# Patient Record
Sex: Male | Born: 1965 | Race: White | Hispanic: Yes | Marital: Married | Smoking: Former smoker
Health system: Southern US, Community
[De-identification: ages and names within clinical notes are randomized; demographics above are authoritative.]

## PROBLEM LIST (undated history)

## (undated) DIAGNOSIS — E119 Type 2 diabetes mellitus without complications: Secondary | ICD-10-CM

## (undated) DIAGNOSIS — I10 Essential (primary) hypertension: Secondary | ICD-10-CM

## (undated) HISTORY — PX: COLONOSCOPY: SHX174

## (undated) HISTORY — DX: Essential (primary) hypertension: I10

## (undated) HISTORY — DX: Type 2 diabetes mellitus without complications: E11.9

## (undated) HISTORY — PX: HERNIA REPAIR: SHX51

---

## 1997-09-22 ENCOUNTER — Encounter: Admission: RE | Admit: 1997-09-22 | Discharge: 1997-09-22 | Payer: Self-pay | Admitting: *Deleted

## 1997-09-24 ENCOUNTER — Encounter: Admission: RE | Admit: 1997-09-24 | Discharge: 1997-09-24 | Payer: Self-pay | Admitting: *Deleted

## 1999-05-17 ENCOUNTER — Ambulatory Visit (HOSPITAL_COMMUNITY): Admission: RE | Admit: 1999-05-17 | Discharge: 1999-05-17 | Payer: Self-pay | Admitting: General Surgery

## 1999-05-17 ENCOUNTER — Encounter: Payer: Self-pay | Admitting: General Surgery

## 1999-05-25 ENCOUNTER — Encounter: Payer: Self-pay | Admitting: General Surgery

## 1999-05-25 ENCOUNTER — Ambulatory Visit (HOSPITAL_COMMUNITY): Admission: RE | Admit: 1999-05-25 | Discharge: 1999-05-25 | Payer: Self-pay | Admitting: General Surgery

## 1999-08-27 ENCOUNTER — Observation Stay (HOSPITAL_COMMUNITY): Admission: RE | Admit: 1999-08-27 | Discharge: 1999-08-28 | Payer: Self-pay | Admitting: General Surgery

## 2000-02-20 ENCOUNTER — Emergency Department (HOSPITAL_COMMUNITY): Admission: EM | Admit: 2000-02-20 | Discharge: 2000-02-21 | Payer: Self-pay | Admitting: Emergency Medicine

## 2000-02-21 ENCOUNTER — Encounter: Payer: Self-pay | Admitting: Emergency Medicine

## 2003-01-31 ENCOUNTER — Emergency Department (HOSPITAL_COMMUNITY): Admission: EM | Admit: 2003-01-31 | Discharge: 2003-01-31 | Payer: Self-pay | Admitting: Emergency Medicine

## 2004-07-02 ENCOUNTER — Ambulatory Visit: Payer: Self-pay | Admitting: Internal Medicine

## 2004-07-05 ENCOUNTER — Ambulatory Visit: Payer: Self-pay | Admitting: *Deleted

## 2004-08-31 ENCOUNTER — Ambulatory Visit: Payer: Self-pay | Admitting: Nurse Practitioner

## 2006-12-13 ENCOUNTER — Emergency Department (HOSPITAL_COMMUNITY): Admission: EM | Admit: 2006-12-13 | Discharge: 2006-12-14 | Payer: Self-pay | Admitting: Emergency Medicine

## 2009-11-17 ENCOUNTER — Encounter (INDEPENDENT_AMBULATORY_CARE_PROVIDER_SITE_OTHER): Payer: Self-pay | Admitting: *Deleted

## 2009-11-17 LAB — CONVERTED CEMR LAB
ALT: 22 units/L (ref 0–53)
AST: 18 units/L (ref 0–37)
Albumin: 4.9 g/dL (ref 3.5–5.2)
BUN: 19 mg/dL (ref 6–23)
Cholesterol: 210 mg/dL — ABNORMAL HIGH (ref 0–200)
Glucose, Bld: 91 mg/dL (ref 70–99)
Sodium: 141 meq/L (ref 135–145)
Triglycerides: 254 mg/dL — ABNORMAL HIGH (ref <150)
VLDL: 51 mg/dL — ABNORMAL HIGH (ref 0–40)

## 2010-05-21 NOTE — Op Note (Signed)
[facility]  Patient:    Steve Drake, Steve Drake                      MRN: [id number] Proc. Date: 08/27/99 Adm. Date:  [id number] Disc. Date: [id number] Attending:  Brandy Hale                           Operative Report  PREOPERATIVE DIAGNOSIS:  Bilateral inguinal herniae.  POSTOPERATIVE DIAGNOSIS:  Bilateral inguinal herniae.  OPERATION PERFORMED:  Laparoscopic repair of bilateral inguinal herniae with mesh.  SURGEON:  Angelia Mould. Derrell Lolling, M.D.  FIRST ASSISTANT:  Currie Paris, M.D.  OPERATIVE INDICATIONS:  This is a 45 year old Hispanic man who was evaluated back in May and June of this year.  He had some left lower quadrant abdominal pain and left groin pain.  On examination, he was found to have bilateral inguinal herniae, which were easily reducible, and some tenderness in the left lower quadrant of the abdomen.  He was treated with antibiotics and a liquid diet for presumed diverticulitis.  His blood work, CBC, amylase and urinalysis were normal.  A CT scan of the abdomen and pelvis was essentially normal.  He got somewhat better.  A barium enema showed two tiny diverticula in the sigmoid colon but otherwise, there was no evidence of diverticulitis and no evidence of stricture.  It was unclear as to why he had left lower quadrant abdominal pain, but it was felt that his herniae might have been contributing to this.  Elective bilateral hernia repairs were offered to him and he has elected to have this done at this time.  OPERATIVE TECHNIQUE:  Following the induction of general endotracheal anesthesia, the patients abdomen was prepped and draped in a sterile fashion. The bladder had been emptied with a Foley catheter.  Marcaine 0.5% with epinephrine were used as a local-infiltration anesthetic.  A transverse incision was made just below the umbilicus.  The fascia was incised transversely, exposing the left rectus muscle medially.  With  blunt dissection, we dissected the rectus muscle away from the posterior rectus sheath and inserted a dissector balloon into the left rectus sheath.  A video camera was inserted and the dissector balloon was inflated under direct vision.  We had good visualization and good dissection bilaterally.  We could see the muscle of the rectus anteriorly, the preperitoneal fat posteriorly, the symphysis pubis in the distance and we could see the inferior epigastric vessels bilaterally on the posterior bellies of the rectus muscles.  After holding the balloon in place for three or four minutes, we deflated the balloon and then inserted an insufflation trocar, inflated the balloon and secured this.  This was connected to the insufflating device at 12 mmHg maximum pressure.  We had a reasonable space to operate in; we could see all the anatomy well.  I found that the patient had bilateral direct inguinal herniae.  On the right side, the hernia was quite large and on the left side, it was smaller.  We dissected all of the incarcerated preperitoneal fat out of the direct hernia sac.  We let the pseudosac retract.  We dissected and identified the symphysis pubis and Coopers ligament bilaterally.  We identified the cord structures bilaterally and dissected these out.  We identified the vas deferens and testicular vessels on both sides.  The patient did not have any evidence of an indirect sac.  The peritoneum on both  sides was dissected back a bit out of the way.  We repaired the herniae on each side with a 4 x 6-inch piece of polypropylene mesh.  The mesh was cut and then rolled up and inserted into the preperitoneal space.  The mesh on each side was deployed transversely, so as to lightly overlap the midline medially and to slightly overlap Coopers ligament inferiorly.  The mesh was tacked in place with a 5-mm tacking device.  We tacked the mesh on each side on the superior rim of the symphysis pubis  and the superior rim of Coopers ligament, then up along the midline, then across the posterior belly of the rectus muscle.  On both sides, we secured the mesh laterally with a 5-mm tacker, but laterally, we took great care to be sure that we could palpate the tacker through the abdominal wall to be sure that we were placing our securing device above the iliopubic tract.  After both sides were repaired, the mesh appeared to be covering the hernia defects generously and we were quite satisfied with the repair.  We held the tail of the mesh in place posterolaterally and removed the camera and released the pneumo-preperitoneum.  Instruments were removed.  At the umbilicus, we opened up the preperitoneal space because the patient had a little bit of pneumoperitoneum and that was released.  The midline fascia was closed with interrupted figure-of-eight sutures of 0 Vicryl.  The skin incisions were closed with subcuticular sutures of 4-0 Vicryl and Steri-Strips.  Clean bandages were placed and the patient taken to the recovery room in stable condition.  Estimated blood loss was about 30 cc.  Complications:  None. Sponge, needle and instrument counts were correct. DD:  08/27/99 TD:  08/30/99 Job: [id number]

## 2013-05-30 ENCOUNTER — Ambulatory Visit: Payer: Self-pay

## 2016-11-26 DIAGNOSIS — T1501XA Foreign body in cornea, right eye, initial encounter: Secondary | ICD-10-CM | POA: Diagnosis not present

## 2016-11-28 DIAGNOSIS — T1501XA Foreign body in cornea, right eye, initial encounter: Secondary | ICD-10-CM | POA: Diagnosis not present

## 2016-11-29 DIAGNOSIS — T1501XA Foreign body in cornea, right eye, initial encounter: Secondary | ICD-10-CM | POA: Diagnosis not present

## 2016-12-01 DIAGNOSIS — T1501XA Foreign body in cornea, right eye, initial encounter: Secondary | ICD-10-CM | POA: Diagnosis not present

## 2017-09-18 ENCOUNTER — Ambulatory Visit: Payer: Self-pay | Admitting: Family Medicine

## 2017-12-21 ENCOUNTER — Encounter: Payer: Self-pay | Admitting: Family Medicine

## 2017-12-21 ENCOUNTER — Ambulatory Visit (INDEPENDENT_AMBULATORY_CARE_PROVIDER_SITE_OTHER): Payer: BLUE CROSS/BLUE SHIELD | Admitting: Family Medicine

## 2017-12-21 VITALS — BP 127/93 | HR 63 | Resp 17 | Ht 68.0 in | Wt 185.4 lb

## 2017-12-21 DIAGNOSIS — Z125 Encounter for screening for malignant neoplasm of prostate: Secondary | ICD-10-CM

## 2017-12-21 DIAGNOSIS — Z130 Encounter for screening for diseases of the blood and blood-forming organs and certain disorders involving the immune mechanism: Secondary | ICD-10-CM | POA: Diagnosis not present

## 2017-12-21 DIAGNOSIS — R7303 Prediabetes: Secondary | ICD-10-CM

## 2017-12-21 DIAGNOSIS — Z0000 Encounter for general adult medical examination without abnormal findings: Secondary | ICD-10-CM

## 2017-12-21 DIAGNOSIS — Z13220 Encounter for screening for lipoid disorders: Secondary | ICD-10-CM

## 2017-12-21 DIAGNOSIS — Z1329 Encounter for screening for other suspected endocrine disorder: Secondary | ICD-10-CM | POA: Diagnosis not present

## 2017-12-21 DIAGNOSIS — Z7689 Persons encountering health services in other specified circumstances: Secondary | ICD-10-CM

## 2017-12-21 DIAGNOSIS — M542 Cervicalgia: Secondary | ICD-10-CM

## 2017-12-21 LAB — POCT URINALYSIS DIP (CLINITEK)
BILIRUBIN UA: NEGATIVE
Blood, UA: NEGATIVE
GLUCOSE UA: NEGATIVE mg/dL
Ketones, POC UA: NEGATIVE mg/dL
Leukocytes, UA: NEGATIVE
Nitrite, UA: NEGATIVE
POC PROTEIN,UA: NEGATIVE
Spec Grav, UA: 1.02 (ref 1.010–1.025)
UROBILINOGEN UA: 0.2 U/dL
pH, UA: 7 (ref 5.0–8.0)

## 2017-12-21 MED ORDER — CYCLOBENZAPRINE HCL 5 MG PO TABS: 5.0000 mg | ORAL_TABLET | Freq: Two times a day (BID) | ORAL | 1 refills | Status: DC | PRN

## 2017-12-21 MED ORDER — MELOXICAM 15 MG PO TABS: 15.0000 mg | ORAL_TABLET | Freq: Every day | ORAL | 0 refills | Status: DC

## 2017-12-21 NOTE — Progress Notes (Signed)
Patient ID: Steve Drake, male    DOB: 05-04-1965, 52 y.o.   MRN: [id number]  PCP: Scot Jun, FNP  Chief Complaint  Patient presents with  . Establish Care  . Annual Exam    Subjective:  HPI  Steve Drake is a 52 y.o. male, nonsmoker presents for complete physical exam.   Complete Physical Exam:  No recent preventative maintenance care. He is concern for diabetes as he reports sometime ago hemoglobin A1C level was check which indicated prediabetes. He denies symptoms of urinary frequency, polyphagia, or polydipsia.  Family history is significant for diabetes-mother. No history of heart disease. Reports an elevated cholesterol panel sometime ago, although doesn't know exact numbers. He is mostly inactive of exercise. Current Body mass index is 28.19 kg/m.  He denies chest pain, shortness of breath, dizziness, edema, cough, or weakness.  Neck Pain Fell at work and hit back of head a few months ago. Continues to have head and neck pain. Pain is exacerbated with extending periods of standing and lifting. He complains of pain in sitting or laying pain. No dizziness or blurring of vision. No prior injury to neck or back. Pain is present now and he rates as 10/10  and characterizes pain as aching and throbbing. He has not taken any medication for pain.   Chronic conditions include: There are no active problems to display for this patient.   Current home medications include: Prior to Admission medications   Not on File   Health Promotion: No routine dental exams. No recent eye exam. Overdue for prostate screening.  Family History  Problem Relation Age of Onset  . Diabetes Mother   . Early death Father   . Hypertension Brother      Allergies  Allergen Reactions  . Almond (Diagnostic)     Throat swelling  . Apple     Throat swelling    Social History   Socioeconomic History  . Marital status: Married    Spouse name: Not on file  . Number of children: Not on  file  . Years of education: Not on file  . Highest education level: Not on file  Occupational History  . Not on file  Social Needs  . Financial resource strain: Not on file  . Food insecurity:    Worry: Not on file    Inability: Not on file  . Transportation needs:    Medical: Not on file    Non-medical: Not on file  Tobacco Use  . Smoking status: Former Research scientist (life sciences)  . Smokeless tobacco: Never Used  Substance and Sexual Activity  . Alcohol use: Not Currently  . Drug use: Never  . Sexual activity: Yes    Partners: Female  Lifestyle  . Physical activity:    Days per week: Not on file    Minutes per session: Not on file  . Stress: Not on file  Relationships  . Social connections:    Talks on phone: Not on file    Gets together: Not on file    Attends religious service: Not on file    Active member of club or organization: Not on file    Attends meetings of clubs or organizations: Not on file    Relationship status: Not on file  . Intimate partner violence:    Fear of current or ex partner: Not on file    Emotionally abused: Not on file    Physically abused: Not on file    Forced sexual activity:  Not on file  Other Topics Concern  . Not on file  Social History Narrative  . Not on file    Review of Systems Pertinent negatives listed in HPI Past Medical, Surgical Family and Social History reviewed and updated.  Objective:   Today's Vitals   12/21/17 1101  BP: (!) 127/93  Pulse: 63  Resp: 17  SpO2: 97%  Weight: 185 lb 6.4 oz (84.1 kg)  Height: 5\' 8"  (1.727 m)    Wt Readings from Last 3 Encounters:  12/21/17 185 lb 6.4 oz (84.1 kg)    Physical Exam Physical Exam: Constitutional: Patient appears well-developed and well-nourished. No distress. HENT: Normocephalic, atraumatic, External right and left ear normal. Oropharynx is clear and moist.  Eyes: Conjunctivae and EOM are normal. PERRLA, no scleral icterus. Neck: Normal ROM. Neck supple. Negative for mass.  Negative for cervical adenopathy. No JVD. No tracheal deviation. No thyromegaly. CVS: RRR, S1/S2 +, no murmurs, no gallops, no carotid bruit.  Pulmonary: Effort and breath sounds normal, no stridor, rhonchi, wheezes, rales.  Abdominal: Soft. BS +, no distension, tenderness, rebound or guarding.  Musculoskeletal: Normal range of motion. No edema and no tenderness.  Neuro: Alert. Normal reflexes, muscle tone coordination. No cranial nerve deficit. Skin: Skin is warm and dry. No rash noted. Not diaphoretic. No erythema. No pallor. Psychiatric: Normal mood and affect. Behavior, judgment, thought content normal.  Assessment & Plan:  1. Encounter to establish care 2. Annual physical exam Age-appropriate anticipatory guidance  - POCT URINALYSIS DIP (CLINITEK) - PSA, total and free - EKG 12-Lead  3. Screening PSA (prostate specific antigen) -checking PSA antigen   4. Prediabetes Aim for 30 minutes of exercise most days, with a goal of 150 minutes per week. -increase foods containing whole grains (one-half of grain intake). -saturated fat intake should be reduced -reduce intake of trans fat (lowers LDL cholesterol and increases HDL cholesterol) -Eat 4-5 small meals during the day to reduce the risk of becoming hungry. Checking: - Comprehensive metabolic panel - Hemoglobin A1c  5. Screening, lipid - Lipid panel  6. Screening for deficiency anemia - CBC with Differential  7. Screening for thyroid disorder - Thyroid Panel With TSH  8. Neck pain, musculoskeletal -will trial course of Meloxicam 15 mg once daily as needed to reduce inflammation -cyclobenzaprine 5 mg once daily as needed for pain   Orders Placed This Encounter  Procedures  . Comprehensive metabolic panel    Order Specific Question:   Has the patient fasted?    Answer:   No  . CBC with Differential  . Hemoglobin A1c  . Lipid panel    Order Specific Question:   Has the patient fasted?    Answer:   No  . Thyroid  Panel With TSH  . PSA, total and free  . POCT URINALYSIS DIP (CLINITEK)  . EKG 12-Lead   Meds ordered this encounter  Medications  . meloxicam (MOBIC) 15 MG tablet    Sig: Take 1 tablet (15 mg total) by mouth daily.    Dispense:  30 tablet    Refill:  0  . cyclobenzaprine (FLEXERIL) 5 MG tablet    Sig: Take 1 tablet (5 mg total) by mouth 2 (two) times daily as needed for muscle spasms.    Dispense:  30 tablet    Refill:  1      Molli Barrows, FNP Primary Care at [facility] [address] [place] [phone]: [phone]

## 2017-12-21 NOTE — Patient Instructions (Signed)
Thank you for choosing Primary Care at [facility] to be your medical home!    Steve Drake was seen by Molli Barrows, FNP today.   Clinton Sawyer Pontiff's primary care provider is Scot Jun, FNP.   For the best care possible, you should try to see Molli Barrows, FNP-C whenever you come to the clinic.   We look forward to seeing you again soon!  If you have any questions about your visit today, please call us at [phone] or feel free to reach your primary care provider via [place].     Cuidados preventivos en los hombres de 74 a 61 aos de edad Preventive Care 40-64 Years, Male Los cuidados preventivos hacen referencia a las opciones en cuanto al estilo de vida y a las visitas al mdico, las cuales pueden promover la salud y Musician. Qu incluyen los cuidados preventivos?   Un examen fsico anual. Esto tambin se conoce como control de bienestar anual.  Exmenes dentales [place] al ao.  Exmenes de la vista de rutina. Pregntele al mdico con qu frecuencia debe realizarse un control de la vista.  Opciones personales de estilo de vida, que incluyen lo siguiente: ? [employer] y las encas a diario. ? Realizar actividad fsica con regularidad. ? Seguir una dieta saludable. ? Evitar el consumo de tabaco y drogas. ? Limitar el consumo de bebidas alcohlicas. ? Counsellor. ? Tomar una dosis baja de [employer] a partir de los 62 aos de [place]. Qu sucede durante un control de bienestar anual? Los servicios y exmenes de deteccin realizados por su mdico durante el control de bienestar anual dependern de su salud general, sus factores de riesgo de estilo de vida y sus antecedentes familiares de enfermedades. Asesoramiento Su mdico puede preguntarle acerca de:  Su consumo de alcohol.  Su consumo de tabaco.  Su consumo de drogas.  Su bienestar emocional.  El bienestar en el hogar y sus relaciones  personales.  Su actividad sexual.  Sus hbitos de alimentacin.  Su trabajo y [place] laboral. Pruebas de deteccin Pueden hacerle las siguientes pruebas o mediciones:  IT consultant, peso e ndice de masa muscular [facility]).  Presin arterial.  Niveles de lpidos y colesterol. Estos se pueden verificar cada 5 aos o, con ms frecuencia, si usted tiene ms de 35 aos de edad.  Control de la piel.  Pruebas de deteccin de cncer de pulmn. Es posible que se le realice esta prueba de deteccin a partir de los 63 aos de edad, si ha fumado durante 30 aos un paquete diario y sigue fumando o dej el hbito en algn momento en los ltimos 15 aos.  Pruebas de Programme researcher, broadcasting/film/video de Surveyor, minerals. Todos los adultos a partir de los 47 aos de edad y [place] 79 aos de edad deben hacerse esta prueba de deteccin. El mdico puede recomendarle las pruebas de deteccin a partir de los 25 aos de [place]. Le realizarn pruebas cada 1 a 10 aos, segn los [place] y el tipo de prueba de Programme researcher, broadcasting/film/video. [employer] tienen un mayor riesgo deben comenzar con las pruebas de deteccin a una edad ms temprana. Las pruebas de deteccin pueden incluir: ? Prueba de sangre oculta en materia fecal con guayacol. ? Prueba inmunoqumica fecal (PIF). ? Prueba de cido desoxirribonucleico (ADN) en heces. ? Colonoscopa virtual. ? Sigmoidoscopa. Durante esta prueba, se utiliza un tubo flexible con una cmara diminuta (sigmoidoscopio) para examinar el recto y la  parte inferior del colon. El sigmoidoscopio se inserta a travs del ano en el recto y la parte inferior del colon. ? Colonoscopa. Durante esta prueba, se utiliza un tubo Rayne, delgado y flexible con una cmara diminuta (colonoscopio) para examinar todo el colon y Designer, television/film set.  Examen de deteccin del cncer de prstata. Las recomendaciones variarn segn sus antecedentes familiares y Hydrologist.  Anlisis de sangre para la deteccin de la hepatitis C.  Anlisis de  sangre para la deteccin de la hepatitis B.  Anlisis de enfermedades de transmisin sexual (ETS).  Pruebas de deteccin de la diabetes. Esto se Set designer un control del azcar en la sangre (glucosa) despus de no haber comido durante un periodo de tiempo (ayuno). Es posible que se le realice esta prueba cada 1 a 3 aos. Hable con su mdico para [employer], las opciones de tratamiento y, si corresponde, la necesidad de Optometrist ms pruebas. Vacunas El mdico puede recomendarle que se aplique algunas vacunas, por ejemplo:  Vacuna contra la gripe. Se recomienda aplicarse esta vacuna todos los aos.  Vacuna contra la difteria, ttanos y tos Dietitian (DTPa, DT). Es posible que tenga que aplicarse un refuerzo contra el ttanos y la difteria (DT) cada 10aos.  Vacuna contra la varicela. Es posible que tenga que aplicrsela si no recibi esta vacuna.  Vacuna contra el herpes zster. Es posible que la necesite despus de los 56 aos de edad.  Vacuna contra el sarampin, rubola y paperas (SRP). Es posible que necesite aplicarse al menos una dosis de la vacuna SRP si naci despus de [phone]. Podra tambin necesitar una segunda dosis.  Vacuna antineumoccica conjugada 13 valente (PCV13). Puede necesitar esta vacuna si tiene determinadas enfermedades y no se vacun anteriormente.  Vacuna antineumoccica de polisacridos (PPSV23). Quizs tenga que aplicarse una o dos dosis si fuma o si tiene determinadas afecciones.  Vacuna antimeningoccica. Puede necesitar esta vacuna si tiene determinadas afecciones.  Vacuna contra la hepatitis A. Es posible que necesite esta vacuna si tiene ciertas afecciones o si viaja o trabaja en lugares en los que podra estar expuesto a la hepatitis A.  Vacuna contra la hepatitis B. Es posible que necesite esta vacuna si tiene ciertas afecciones o si viaja o trabaja en lugares en los que podra estar expuesto a la hepatitis B.  Vacuna contra la  Haemophilus influenzae de tipob (Hib). Es posible que necesite esta vacuna si tiene determinados factores de [place]. Hable con el mdico sobre qu pruebas de deteccin y qu vacunas necesita, y con qu frecuencia las necesita. Esta informacin no tiene Marine scientist el consejo del mdico. Asegrese de hacerle al mdico cualquier pregunta que tenga. Document Released: 05/03/2016 Document Revised: 04/03/2017 Document Reviewed: 10/21/2014 Elsevier Interactive Patient Education  2019 [employer].

## 2017-12-22 LAB — COMPREHENSIVE METABOLIC PANEL
A/G RATIO: 1.5 (ref 1.2–2.2)
ALBUMIN: 4.5 g/dL (ref 3.5–5.5)
ALK PHOS: 70 IU/L (ref 39–117)
ALT: 18 IU/L (ref 0–44)
AST: 18 IU/L (ref 0–40)
BILIRUBIN TOTAL: 0.5 mg/dL (ref 0.0–1.2)
BUN / CREAT RATIO: 14 (ref 9–20)
BUN: 16 mg/dL (ref 6–24)
CHLORIDE: 102 mmol/L (ref 96–106)
CO2: 23 mmol/L (ref 20–29)
Calcium: 9.9 mg/dL (ref 8.7–10.2)
Creatinine, Ser: 1.15 mg/dL (ref 0.76–1.27)
GFR calc non Af Amer: 72 mL/min/{1.73_m2} (ref >59)
GFR, EST AFRICAN AMERICAN: 83 mL/min/{1.73_m2} (ref >59)
GLOBULIN, TOTAL: 3 g/dL (ref 1.5–4.5)
GLUCOSE: 109 mg/dL — AB (ref 65–99)
Potassium: 4.9 mmol/L (ref 3.5–5.2)
SODIUM: 140 mmol/L (ref 134–144)
Total Protein: 7.5 g/dL (ref 6.0–8.5)

## 2017-12-22 LAB — THYROID PANEL WITH TSH
Free Thyroxine Index: 2.5 (ref 1.2–4.9)
T3 Uptake Ratio: 30 % (ref 24–39)
T4 TOTAL: 8.2 ug/dL (ref 4.5–12.0)
TSH: 1.19 u[IU]/mL (ref 0.450–4.500)

## 2017-12-22 LAB — CBC WITH DIFFERENTIAL/PLATELET
BASOS ABS: 0.1 10*3/uL (ref 0.0–0.2)
Basos: 1 %
EOS (ABSOLUTE): 0.1 10*3/uL (ref 0.0–0.4)
Eos: 2 %
HEMATOCRIT: 49 % (ref 37.5–51.0)
Hemoglobin: 16.1 g/dL (ref 13.0–17.7)
Immature Grans (Abs): 0 10*3/uL (ref 0.0–0.1)
Immature Granulocytes: 0 %
LYMPHS ABS: 2.5 10*3/uL (ref 0.7–3.1)
Lymphs: 38 %
MCH: 29.4 pg (ref 26.6–33.0)
MCHC: 32.9 g/dL (ref 31.5–35.7)
MCV: 90 fL (ref 79–97)
MONOS ABS: 0.4 10*3/uL (ref 0.1–0.9)
Monocytes: 6 %
NEUTROS ABS: 3.5 10*3/uL (ref 1.4–7.0)
Neutrophils: 53 %
Platelets: 316 10*3/uL (ref 150–450)
RBC: 5.47 x10E6/uL (ref 4.14–5.80)
RDW: 11.7 % — AB (ref 12.3–15.4)
WBC: 6.6 10*3/uL (ref 3.4–10.8)

## 2017-12-22 LAB — LIPID PANEL
CHOL/HDL RATIO: 3.8 ratio (ref 0.0–5.0)
Cholesterol, Total: 225 mg/dL — ABNORMAL HIGH (ref 100–199)
HDL: 59 mg/dL (ref >39)
LDL Calculated: 149 mg/dL — ABNORMAL HIGH (ref 0–99)
Triglycerides: 85 mg/dL (ref 0–149)
VLDL CHOLESTEROL CAL: 17 mg/dL (ref 5–40)

## 2017-12-22 LAB — HEMOGLOBIN A1C
ESTIMATED AVERAGE GLUCOSE: 131 mg/dL
HEMOGLOBIN A1C: 6.2 % — AB (ref 4.8–5.6)

## 2017-12-22 LAB — PSA, TOTAL AND FREE
PROSTATE SPECIFIC AG, SERUM: 0.4 ng/mL (ref 0.0–4.0)
PSA, Free Pct: 32.5 %
PSA, Free: 0.13 ng/mL

## 2017-12-29 ENCOUNTER — Other Ambulatory Visit: Payer: Self-pay | Admitting: Family Medicine

## 2017-12-29 MED ORDER — ATORVASTATIN CALCIUM 20 MG PO TABS: 20.0000 mg | ORAL_TABLET | Freq: Every day | ORAL | 3 refills | Status: DC

## 2017-12-29 MED ORDER — METFORMIN HCL 500 MG PO TABS: 500.0000 mg | ORAL_TABLET | Freq: Two times a day (BID) | ORAL | 3 refills | Status: DC

## 2017-12-29 NOTE — Telephone Encounter (Signed)
Contact patient to advise that his most current labs were significant for continued prediabetes with an A1c of 6.2 for which I would like to start him on metformin 500 mg twice daily to reduce the risk of him developing  type 2 diabetes.  His cholesterol panel was also grossly abnormal therefore I would like to start him immediately on statin therapy with atorvastatin 20 mg once daily at dinnertime.  Highly encourage exercise, increasing intake of fruits and vegetables and eating only lean meat which is baked and reduce eating fried or foods prepared in cooking oil. Ok to release medication once patient is contacted.

## 2017-12-29 NOTE — Telephone Encounter (Signed)
Patient notified of lab results & recommendations. Expressed understanding. Rxs sent to pharmacy on file.

## 2018-01-09 ENCOUNTER — Ambulatory Visit (INDEPENDENT_AMBULATORY_CARE_PROVIDER_SITE_OTHER): Payer: BLUE CROSS/BLUE SHIELD | Admitting: Family Medicine

## 2018-01-09 ENCOUNTER — Encounter: Payer: Self-pay | Admitting: Family Medicine

## 2018-01-09 VITALS — BP 138/88 | HR 90 | Resp 17 | Ht 68.0 in | Wt 184.4 lb

## 2018-01-09 DIAGNOSIS — E785 Hyperlipidemia, unspecified: Secondary | ICD-10-CM | POA: Diagnosis not present

## 2018-01-09 DIAGNOSIS — M542 Cervicalgia: Secondary | ICD-10-CM

## 2018-01-09 DIAGNOSIS — T466X5A Adverse effect of antihyperlipidemic and antiarteriosclerotic drugs, initial encounter: Secondary | ICD-10-CM

## 2018-01-09 DIAGNOSIS — R7303 Prediabetes: Secondary | ICD-10-CM | POA: Diagnosis not present

## 2018-01-09 DIAGNOSIS — G44209 Tension-type headache, unspecified, not intractable: Secondary | ICD-10-CM | POA: Diagnosis not present

## 2018-01-09 MED ORDER — FISH OIL 1000 MG PO CPDR: 1000.0000 mg | DELAYED_RELEASE_CAPSULE | Freq: Two times a day (BID) | ORAL | 2 refills | Status: DC

## 2018-01-09 MED ORDER — FISH OIL 1000 MG PO CPDR: 2000.0000 mg | DELAYED_RELEASE_CAPSULE | Freq: Two times a day (BID) | ORAL | 2 refills | Status: DC

## 2018-01-09 NOTE — Patient Instructions (Addendum)
Discontinue Astorvastatin and start fish oil 1,000 mg once daily.  I will recheck your cholesterol and Diabetes lab work in 6 months.  Continue good eating habits and incorporate exercise.    Cholesterol Content in Foods Cholesterol is a waxy, fat-like substance that helps to carry fat in the blood. The body needs cholesterol in small amounts, but too much cholesterol can cause damage to the arteries and heart. Most people should eat less than 200 milligrams (mg) of cholesterol a day. Foods with cholesterol  Cholesterol is found in animal-based foods, such as meat, seafood, and dairy. Generally, low-fat dairy and lean meats have less cholesterol than full-fat dairy and fatty meats. The milligrams of cholesterol per serving (mg per serving) of common cholesterol-containing foods are listed below. Meat and other proteins  Egg - one large whole egg has 186 mg.  Veal shank - 4 oz has 141 mg.  Lean ground [place] (93% lean) - 4 oz has 118 mg.  Fat-trimmed lamb loin - 4 oz has 106 mg.  Lean ground beef (90% lean) - 4 oz has 100 mg.  Lobster - 3.5 oz has 90 mg.  Pork loin chops - 4 oz has 86 mg.  Canned salmon - 3.5 oz has 83 mg.  Fat-trimmed beef top loin - 4 oz has 78 mg.  Frankfurter - 1 frank (3.5 oz) has 77 mg.  Crab - 3.5 oz has 71 mg.  Roasted chicken without skin, white meat - 4 oz has 66 mg.  Light bologna - 2 oz has 45 mg.  Deli-cut [place] - 2 oz has 31 mg.  Canned tuna - 3.5 oz has 31 mg.  Bacon - 1 oz has 29 mg.  Oysters and mussels (raw) - 3.5 oz has 25 mg.  Mackerel - 1 oz has 22 mg.  Trout - 1 oz has 20 mg.  Pork sausage - 1 link (1 oz) has 17 mg.  Salmon - 1 oz has 16 mg.  Tilapia - 1 oz has 14 mg. Dairy  Soft-serve ice cream -  cup (4 oz) has 103 mg.  Whole-milk yogurt - 1 cup (8 oz) has 29 mg.  Cheddar cheese - 1 oz has 28 mg.  American cheese - 1 oz has 28 mg.  Whole milk - 1 cup (8 oz) has 23 mg.  2% milk - 1 cup (8 oz) has 18  mg.  Cream cheese - 1 tablespoon (Tbsp) has 15 mg.  Cottage cheese -  cup (4 oz) has 14 mg.  Low-fat (1%) milk - 1 cup (8 oz) has 10 mg.  Sour cream - 1 Tbsp has 8.5 mg.  Low-fat yogurt - 1 cup (8 oz) has 8 mg.  Nonfat Greek yogurt - 1 cup (8 oz) has 7 mg.  Half-and-half cream - 1 Tbsp has 5 mg. Fats and oils  Cod liver oil - 1 tablespoon (Tbsp) has 82 mg.  Butter - 1 Tbsp has 15 mg.  Lard - 1 Tbsp has 14 mg.  Bacon grease - 1 Tbsp has 14 mg.  Mayonnaise - 1 Tbsp has 5-10 mg.  Margarine - 1 Tbsp has 3-10 mg. Exact amounts of cholesterol in these foods may vary depending on specific ingredients and brands. Foods without cholesterol Most plant-based foods do not have cholesterol unless you combine them with a food that has cholesterol. Foods without cholesterol include:  Grains and cereals.  Vegetables.  Fruits.  Vegetable oils, such as olive, canola, and sunflower oil.  Legumes, such  as peas, beans, and lentils.  Nuts and seeds.  Egg whites. Summary  The body needs cholesterol in small amounts, but too much cholesterol can cause damage to the arteries and heart.  Most people should eat less than 200 milligrams (mg) of cholesterol a day. This information is not intended to replace advice given to you by your health care provider. Make sure you discuss any questions you have with your health care provider. Document Released: 08/16/2016 Document Revised: 08/16/2016 Document Reviewed: 08/16/2016 Elsevier Interactive Patient Education  [employer].

## 2018-01-09 NOTE — Progress Notes (Signed)
Ck   Established Patient Office Visit  Subjective:  Patient ID: Steve Drake, male    DOB: [birth year]  Age: 53 y.o. MRN: [id number]  CC:  Chief Complaint  Patient presents with  . Neck Pain  . Headache    HPI [name] presents for follow-up of headaches and neck pain.  Patient was seen in office on 12/21/2017 to establish care and for complete physical.  During that visit he reported some ongoing neck pain which she sustained after a fall which had occurred several months ago. With neck pain came associated headache pain.  He was placed on Flexeril and recommended to take OTC acetaminophen or ibuprofen. He reports today that neck pain has almost completely resolved.  He is only had to take Flexeril a few times and has several tablets remaining.  He has not experienced a headache at all since his last office visit.  He reports that started taking the atorvastatin and has experienced severe joint pain and wishes to the stop. Recent lipid panel was significant for hyperlipidemia. He take fish oil occasionally although uncertain of dose. He is non-smoker and has prediabetes. He also reports that since starting metformin his blood sugar has dropped to 70. He has been checking blood sugar with a family members meter. Feels as if metformin dose is too much.  Family History  Problem Relation Age of Onset  . Diabetes Mother   . Early death Father   . Hypertension Brother     Social History   Socioeconomic History  . Marital status: Married    Spouse name: Not on file  . Number of children: Not on file  . Years of education: Not on file  . Highest education level: Not on file  Occupational History  . Not on file  Social Needs  . Financial resource strain: Not on file  . Food insecurity:    Worry: Not on file    Inability: Not on file  . Transportation needs:    Medical: Not on file    Non-medical: Not on file  Tobacco Use  . Smoking status: Former Research scientist (life sciences)  . Smokeless  tobacco: Never Used  Substance and Sexual Activity  . Alcohol use: Not Currently  . Drug use: Never  . Sexual activity: Yes    Partners: Female  Lifestyle  . Physical activity:    Days per week: Not on file    Minutes per session: Not on file  . Stress: Not on file  Relationships  . Social connections:    Talks on phone: Not on file    Gets together: Not on file    Attends religious service: Not on file    Active member of club or organization: Not on file    Attends meetings of clubs or organizations: Not on file    Relationship status: Not on file  . Intimate partner violence:    Fear of current or ex partner: Not on file    Emotionally abused: Not on file    Physically abused: Not on file    Forced sexual activity: Not on file  Other Topics Concern  . Not on file  Social History Narrative  . Not on file    Outpatient Medications Prior to Visit  Medication Sig Dispense Refill  . atorvastatin (LIPITOR) 20 MG tablet Take 1 tablet (20 mg total) by mouth daily. 90 tablet 3  . cyclobenzaprine (FLEXERIL) 5 MG tablet Take 1 tablet (5 mg total) by mouth 2 (  two) times daily as needed for muscle spasms. 30 tablet 1  . metFORMIN (GLUCOPHAGE) 500 MG tablet Take 1 tablet (500 mg total) by mouth 2 (two) times daily with a meal. 180 tablet 3  . meloxicam (MOBIC) 15 MG tablet Take 1 tablet (15 mg total) by mouth daily. 30 tablet 0   No facility-administered medications prior to visit.     Allergies  Allergen Reactions  . Almond (Diagnostic)     Throat swelling  . Apple     Throat swelling    ROS Review of Systems Pertinent negatives listed in HPI   Objective:    Physical Exam BP 138/88   Pulse 90   Resp 17   Ht 5\' 8"  (1.727 m)   Wt 184 lb 6.4 oz (83.6 kg)   SpO2 98%   BMI 28.04 kg/m  Wt Readings from Last 3 Encounters:  12/21/17 185 lb 6.4 oz (84.1 kg)   General appearance: alert, well developed, well nourished, cooperative and in no distress Head: Normocephalic,  without obvious abnormality, atraumatic Respiratory: Respirations even and unlabored, normal respiratory rate Heart:  Extremities: No gross deformities Skin: Skin color, texture, turgor normal. No rashes seen  Psych: Appropriate mood and affect. Neurologic: Mental status: Alert, oriented to person, place, and time, thought content appropriate.  Health Maintenance Due  Topic Date Due  . Hepatitis C Screening  05-02-1965  . HIV Screening  04/23/1978  . TETANUS/TDAP  04/23/1982  . COLONOSCOPY  04/22/2013  . INFLUENZA VACCINE  08/03/2017    There are no preventive care reminders to display for this patient.  Lab Results  Component Value Date   TSH 1.190 12/21/2017   Lab Results  Component Value Date   WBC 6.6 12/21/2017   HGB 16.1 12/21/2017   HCT 49.0 12/21/2017   MCV 90 12/21/2017   PLT 316 12/21/2017   Lab Results  Component Value Date   NA 140 12/21/2017   K 4.9 12/21/2017   CO2 23 12/21/2017   GLUCOSE 109 (H) 12/21/2017   BUN 16 12/21/2017   CREATININE 1.15 12/21/2017   BILITOT 0.5 12/21/2017   ALKPHOS 70 12/21/2017   AST 18 12/21/2017   ALT 18 12/21/2017   PROT 7.5 12/21/2017   ALBUMIN 4.5 12/21/2017   CALCIUM 9.9 12/21/2017   Lab Results  Component Value Date   CHOL 225 (H) 12/21/2017   Lab Results  Component Value Date   HDL 59 12/21/2017   Lab Results  Component Value Date   LDLCALC 149 (H) 12/21/2017   Lab Results  Component Value Date   TRIG 85 12/21/2017   Lab Results  Component Value Date   CHOLHDL 3.8 12/21/2017   Lab Results  Component Value Date   HGBA1C 6.2 (H) 12/21/2017      Assessment & Plan:  1. Hyperlipidemia, unspecified hyperlipidemia type 2. Adverse reaction to statin medication -Recently started Atorvastatin and experienced a reaction to the medication. -Will discontinue Atorvastatin today -Recommended lifestyle changes and will trial Fish Oil 2,000 mg daily to improve overall cholesterol panel -Repeat fasting lipid  panel in 3-6 months   3. Tension-type headache, not intractable, unspecified chronicity pattern, resolved  4. Neck pain, resolved  5. Prediabetes.A1C 6.2, advised okay to take metformin 500 mg once daily. Recommended continuing metformin as patient is at increased risk of developing diabetes, given age, ethnicity, sedentary lifestyle, family history, and obesity.    Follow-up: Return in about 6 months (around 07/10/2018) for chronic condition.    Joelene Millin  Kenton Kingfisher, [place]

## 2018-05-01 DIAGNOSIS — H10211 Acute toxic conjunctivitis, right eye: Secondary | ICD-10-CM | POA: Diagnosis not present

## 2018-05-02 DIAGNOSIS — H10211 Acute toxic conjunctivitis, right eye: Secondary | ICD-10-CM | POA: Diagnosis not present

## 2018-05-17 ENCOUNTER — Telehealth: Payer: Self-pay | Admitting: Family Medicine

## 2018-05-17 MED ORDER — MELOXICAM 15 MG PO TABS: 15.0000 mg | ORAL_TABLET | Freq: Every day | ORAL | 0 refills | Status: DC

## 2018-05-17 MED ORDER — METFORMIN HCL 500 MG PO TABS: 500.0000 mg | ORAL_TABLET | Freq: Two times a day (BID) | ORAL | 3 refills | Status: DC

## 2018-05-17 NOTE — Telephone Encounter (Signed)
Completed.

## 2018-05-17 NOTE — Telephone Encounter (Signed)
1) Medication(s) Requested (by name): meloxicam metformin 2) Pharmacy of Choice: [facility] on [address] 3) Special Requests:   Approved medications will be sent to the pharmacy, we will reach out if there is an issue.  Requests made after 3pm may not be addressed until the following business day!  If a patient is unsure of the name of the medication(s) please note and ask patient to call back when they are able to provide all info, do not send to responsible party until all information is available!

## 2018-07-09 ENCOUNTER — Telehealth: Payer: Self-pay

## 2018-07-09 NOTE — Telephone Encounter (Signed)
Called patient to do their pre-visit COVID screening using [employer]([place], [phone])  Have you been tested for COVID or are you currently waiting for COVID test results? no  Have you recently traveled internationally([place], [place], [place], [place], [place]) or within the [place] to a hotspot area([place], [place], [place], [place])? no  Are you currently experiencing any of the following: fever, cough, SHOB, fatigue, body aches, loss of smell, rash, diarrhea, vomiting, severe headaches, weakness, sore throat? no  Have you been in contact with anyone who has recently travelled? no  Have you been in contact with anyone who is experiencing any of the above symptoms or been diagnosed with COVID  or works in or has recently visited a SNF? no  Asked patient to come to appointment fasting in order to repeat labs.

## 2018-07-10 ENCOUNTER — Other Ambulatory Visit: Payer: Self-pay

## 2018-07-10 ENCOUNTER — Ambulatory Visit (INDEPENDENT_AMBULATORY_CARE_PROVIDER_SITE_OTHER): Payer: BLUE CROSS/BLUE SHIELD | Admitting: Family Medicine

## 2018-07-10 ENCOUNTER — Encounter: Payer: Self-pay | Admitting: Family Medicine

## 2018-07-10 VITALS — BP 145/88 | HR 64 | Temp 97.3°F | Resp 17 | Ht 68.0 in | Wt 191.6 lb

## 2018-07-10 DIAGNOSIS — R7303 Prediabetes: Secondary | ICD-10-CM

## 2018-07-10 DIAGNOSIS — E785 Hyperlipidemia, unspecified: Secondary | ICD-10-CM

## 2018-07-10 NOTE — Progress Notes (Signed)
Patient ID: Steve Drake, male    DOB: 10-18-1965, 53 y.o.   MRN: [id number]  PCP: Scot Jun, FNP  Chief Complaint  Patient presents with  . Prediabetes  . Hyperlipidemia    Subjective:  HPI Steve Drake is a 53 y.o. male presents for evaluation prediabetes and hyperlipidemia follow-up.  Hyperlipidemia Steve Drake was last seen in office on 12/22/18.  He had routine labs and was found to have had mixed hyperlipidemia.  At that time he was started on atorvastatin however reported joint and muscle aches.  Medication was discontinued and patient was recommended to start on fish oil 2000 mg daily.  Reports he has been taking medication consistently.  Trying to incorporate more vegetables and fruits into diet.  He has not started engaging in routine activity although his employment responsibilities are physically active.   Prediabetes Last A1C 6.2. Recently started on metformin. Reports consistently taking medication daily. Doesn't check blood sugar at home.  He has not sustained any significant weight loss since his last office visit.  In fact he has gained approximately 5 pounds.  He denies 3 P's or changes in vision.  Social History   Socioeconomic History  . Marital status: Married    Spouse name: Not on file  . Number of children: Not on file  . Years of education: Not on file  . Highest education level: Not on file  Occupational History  . Not on file  Social Needs  . Financial resource strain: Not on file  . Food insecurity    Worry: Not on file    Inability: Not on file  . Transportation needs    Medical: Not on file    Non-medical: Not on file  Tobacco Use  . Smoking status: Former Research scientist (life sciences)  . Smokeless tobacco: Never Used  Substance and Sexual Activity  . Alcohol use: Not Currently  . Drug use: Never  . Sexual activity: Yes    Partners: Female  Lifestyle  . Physical activity    Days per week: Not on file    Minutes per session: Not on file  . Stress: Not  on file  Relationships  . Social Herbalist on phone: Not on file    Gets together: Not on file    Attends religious service: Not on file    Active member of club or organization: Not on file    Attends meetings of clubs or organizations: Not on file    Relationship status: Not on file  . Intimate partner violence    Fear of current or ex partner: Not on file    Emotionally abused: Not on file    Physically abused: Not on file    Forced sexual activity: Not on file  Other Topics Concern  . Not on file  Social History Narrative  . Not on file    Family History  Problem Relation Age of Onset  . Diabetes Mother   . Early death Father   . Hypertension Brother      Review of Systems  Allergies  Allergen Reactions  . Almond (Diagnostic)     Throat swelling  . Apple     Throat swelling    Prior to Admission medications   Medication Sig Start Date End Date Taking? Authorizing Provider  metFORMIN (GLUCOPHAGE) 500 MG tablet Take 1 tablet (500 mg total) by mouth 2 (two) times daily with a meal. 05/17/18  Yes Scot Jun, FNP  Omega-3 Fatty  Acids (FISH OIL) 1000 MG CPDR Take 1,000 mg by mouth 2 (two) times daily. 01/09/18  Yes Scot Jun, FNP    Past Medical, Surgical Family and Social History reviewed and updated.    Objective:   Today's Vitals   07/10/18 0952  BP: (!) 145/88  Pulse: 64  Resp: 17  Temp: (!) 97.3 F (36.3 C)  TempSrc: Temporal  SpO2: 97%  Weight: 191 lb 9.6 oz (86.9 kg)  Height: 5\' 8"  (1.727 m)    BP Readings from Last 3 Encounters:  07/10/18 (!) 145/88  01/09/18 138/88  12/21/17 (!) 127/93    Filed Weights   07/10/18 0952  Weight: 191 lb 9.6 oz (86.9 kg)       Physical Exam General appearance: alert, well developed, well nourished, cooperative and in no distress Head: Normocephalic, without obvious abnormality, atraumatic Respiratory: Respirations even and unlabored, normal respiratory rate Heart: rate and  rhythm normal. No gallop or murmurs noted on exam  Extremities: No gross deformities Skin: Skin color, texture, turgor normal. No rashes seen  Psych: Appropriate mood and affect. Neurologic: Mental status: Alert, oriented to person, place, and time, thought content appropriate.  No results found for: POCGLU  Lab Results  Component Value Date   HGBA1C 6.2 (H) 12/21/2017      Assessment & Plan:  1. Hyperlipidemia, unspecified hyperlipidemia type Repeating a fasting lipid panel. Given patient is intolerant of statins reemphasized the importance of dietary choices and exercise in obtaining proper control of cholesterol. - Lipid Panel  2. Prediabetes -Continue metformin 500 mg twice daily Follow recommendations see #1 - Hemoglobin W2X - Basic Metabolic Panel   Return for follow-up in 6 months   Molli Barrows, FNP Primary Care at [facility] [address] [place] [phone]: [phone]

## 2018-07-10 NOTE — Patient Instructions (Addendum)
Prevencin del colesterol alto Preventing High Cholesterol El colesterol es una sustancia cerosa parecida a la grasa que el organismo necesita en pequeas cantidades. El hgado fabrica todo el colesterol que el cuerpo necesita. Tener el colesterol alto (hipercolesterolemia) aumenta el riesgo de sufrir enfermedades cardacas y accidentes cerebrovasculares. El colesterol extra (exceso de colesterol) proviene de los alimentos que come, por ejemplo grasas de origen animal (grasas saturadas) de la carne y de algunos productos lcteos. El colesterol alto con frecuencia puede prevenirse con cambios en la dieta y en el estilo de vida. Si ya tiene Orthoptist, puede controlarlo haciendo cambios en la dieta y en el estilo de vida, adems de con medicamentos. Qu cambios en la alimentacin se pueden hacer?  Coma menos grasas saturadas. Los alimentos que contienen grasas saturadas incluyen las carnes rojas y algunos productos lcteos.  Evite las carnes procesadas, como el tocino, los fiambres y embutidos.  Evite las grasas trans, que se encuentran en la margarina y en algunos productos horneados.  Evite alimentos y bebidas que tengan azcares agregados.  Consuma ms frutas, verduras y cereales integrales.  Elija fuentes saludables de protenas, como el pescado, la carne de ave y los frutos secos.  Elija fuentes saludables de grasas, por ejemplo: ? Frutos secos.  ? Aceites vegetales, en particular el aceite de oliva. ? Pescados que contengan grasas saludables (cidos grasos omega-3), como la caballa o el salmn. Qu cambios en el estilo de vida se pueden realizar?   Baje de peso si es necesario. Bajar entre 5 y 10lb (2,3 a 4,5kg) puede ayudar a prevenir o Academic librarian alto y a Psychiatrist de padecer diabetes y presin arterial alta (hipertensin). Pdale al mdico que le recomiende una dieta y un plan de ejercicios para bajar de peso de forma segura.  Ejerctese lo  suficiente. Debe realizar al menos 112minutos de ejercicios de intensidad moderada todas las semanas. ? Patent examiner en sesiones cortas de ejercicios, varias veces al da, o puede realizar sesiones ms largas, pero menos veces por semana. Por ejemplo, puede realizar una caminata enrgica o andar en bicicleta durante 46minutos, 3veces al da, durante 5das a la semana.  No fume. Si necesita ayuda para dejar de fumar, consulte al mdico.  Limite el consumo de bebidas alcohlicas. Si bebe alcohol, limite el consumo a no ms de 59medida por da si es mujer y no est [place], y 8medidas por da si es hombre. Una medida equivale a 12onzas de cerveza, 5onzas de vino o 1onzas de bebidas alcohlicas de alta graduacin. Por qu son importantes estos cambios?  Si tiene Orthoptist, se pueden acumular depsitos de esta sustancia (placa) en las paredes de los vasos sanguneos. La placa hace que las arterias se vuelvan ms estrechas y rgidas, lo que puede limitar u obstruir la circulacin sangunea y Actor la formacin de cogulos de [place]. Esto aumenta en gran medida el riesgo de infarto de miocardio y de accidente cerebrovascular. Hacer cambios en la dieta y en el estilo de vida puede ayudar a reducir el riesgo de sufrir estas afecciones potencialmente mortales. Qu puedo hacer para reducir mis riesgos?  Controle los factores de riesgo del colesterol alto. Hable con el mdico acerca de todos los factores de riesgo y cmo reducir Catering manager.  Controle otras afecciones que pueda tener, por ejemplo diabetes o presin arterial alta (hipertensin).  Contrlese el colesterol a intervalos regulares.  Concurra a todas las visitas de control como se  lo haya indicado el mdico. Esto es importante. Cmo se trata? Adems de los cambios en la dieta y en el estilo de vida, el mdico puede recomendarle que tome ciertos medicamentos para reducir el colesterol, por ejemplo, medicamentos  que reducen la cantidad de colesterol producida por el hgado. Es posible que necesite tomar medicamentos si:  No logra reducir lo suficiente el colesterol con cambios en la dieta y en el estilo de vida.  Tiene colesterol alto y presenta otros factores de riesgo de sufrir enfermedades cardacas o accidentes cerebrovasculares. Tome los medicamentos de venta libre y los recetados solamente como se lo haya indicado el mdico. Dnde encontrar ms informacin  Asociacin Estadounidense de Catering manager (Musician, Medical illustrator): [email or web address]  [place], Education officer, museum y Music therapist (Water engineer, Lung, and [place]): [email or web address] Resumen  El colesterol alto aumenta el riesgo de sufrir enfermedades cardacas y accidentes cerebrovasculares. Si mantiene el colesterol bajo, puede reducir el riesgo de tener estas afecciones.  Los [employer] dieta y en el estilo de vida son los pasos ms importantes para prevenir Advertising account planner.  Consulte al mdico para controlar los factores de riesgo y hgase anlisis de sangre con regularidad. Esta informacin no tiene Marine scientist el consejo del mdico. Asegrese de hacerle al mdico cualquier pregunta que tenga. Document Released: 01/04/2015 Document Revised: 03/30/2016 Document Reviewed: 01/04/2015 Elsevier Patient Education  2020 [place] para bajar de peso Exercising to [employer] El ejercicio es la actividad fsica estructurada y repetitiva que se realiza para mejorar el [place] fsico y Technical sales engineer. Hacer ejercicio de forma regular es importante para todos. Es especialmente importante si tiene sobrepeso. El sobrepeso aumenta el riesgo de tener enfermedad cardaca, accidente cerebrovascular, diabetes, presin arterial alta y varios tipos de cncer. Reducir la  ingesta de caloras y hacer ejercicio pueden ayudarlo a bajar de [place]. El ejercicio por lo general se clasifica como de intensidad moderada o vigorosa. Para bajar de peso, la [employer] de las personas debe hacer una determinada cantidad de ejercicio de intensidad moderada o vigorosa cada semana. Ejercicio de intensidad moderada  El ejercicio de intensidad moderada es cualquier actividad que lo haga moverse lo suficiente como para quemar al menos tres veces ms energa (caloras) que si estuviera sentado. Algunos ejemplos de ejercicio de intensidad moderada son:  Caminar una milla (1,6 kilmetros) en 15 minutos.  Hacer trabajos de jardinera livianos.  Andar en bicicleta a un ritmo fcil de aguantar. La [employer] de las personas debe hacer al menos 150 minutos (2 horas y 30 minutos) por semana de ejercicio de intensidad moderada para Theatre manager su Engineer, site. Ejercicio de intensidad vigorosa El ejercicio de intensidad vigorosa es cualquier actividad que lo haga moverse lo suficiente como para quemar al menos seis veces ms caloras que si estuviera sentado. Al hacer ejercicio a esta intensidad, su nivel de esfuerzo debera ser lo suficientemente alto como para no permitirle Programmer, systems. Algunos ejemplos de ejercicio de intensidad vigorosa son:  Optometrist.  Practicar un deporte de equipo, como ftbol americano, baloncesto y ftbol.  Saltar la cuerda. La [employer] de las personas debe hacer al menos 75 minutos (1 hora y 15 minutos) por semana de ejercicio de intensidad vigorosa para mantener su Engineer, site. Cmo puede afectarme el ejercicio? Cuando hace suficiente ejercicio como para quemar ms caloras que las que consume, pierde Marvin. Tambin reduce la grasa corporal y aumenta la masa muscular. Cuanto ms msculo tenga, mayor cantidad de Nurse, children's. El  ejercicio tambin:  Mejora el estado de nimo.  Reduce el estrs y las tensiones.  Mejora el estado fsico general, la  flexibilidad y la resistencia.  Aumenta la fuerza sea. La cantidad de ejercicio que necesita realizar para bajar de peso depende de:  Su edad.  El tipo de ejercicio.  Cualquier afeccin de [employer].  Su capacidad fsica general. Pregntele al mdico cunto ejercicio debe realizar y qu tipos de actividades son seguras para usted. Qu medidas puedo tomar para bajar de peso? Nutricin   Principal Financial dieta como se lo haya indicado el mdico o especialista en alimentacin y nutricin (nutricionista). Esto puede incluir lo siguiente: ? Consumir menos caloras. ? Consumir ms protenas. ? Consumir menos grasas no saludables. ? Seguir una dieta que incluya frutas y verduras frescas, cereales integrales, productos lcteos semidescremados y Advertising account planner. ? Evite los alimentos con grasa, sal y azcar agregadas.  Beba gran cantidad de agua mientras hace ejercicio para evitar la deshidratacin o los golpes de Freight forwarder. Actividad  Elija una actividad que disfrute y establezca objetivos realistas. El mdico puede ayudarlo a Paediatric nurse un plan de ejercicio que funcione para usted.  Haga ejercicio a una intensidad moderada o vigorosa la [employer] Financial de la [place]. ? La intensidad de la actividad fsica puede variar de Ardelia Mems persona a Theatre manager. Puede saber qu tan intensa una rutina de ejercicios es para usted al Sales promotion account executive atencin a su respiracin y latidos cardacos. La State Farm de las personas notar que su respiracin y latidos cardacos se aceleran al Optometrist ejercicio de mayor intensidad.  Haga entrenamiento de resistencia dos veces por semana, como: ? Flexiones de Merrill Lynch. ? Abdominales. ? Levantamiento de pesas. ? Uso de bandas elsticas de resistencia.  Hacer ejercicio en perodos cortos de Estée Lauder ser tan til como los perodos largos y estructurados de ejercicio. Si tiene dificultad para encontrar tiempo para Engineer, site, trate de incluir el ejercicio en su rutina  diaria. ? Levntese, estrese y camine cada 64minutos a lo largo del Training and development officer. ? Vaya a caminar durante su hora de almuerzo. ? Estacione el auto lejos de su lugar de destino. ? Si [place] transporte pblico, bjese una parada antes y camine el resto del camino. ? Pngase de pie y camine cada vez que hable por telfono. ? Utilice la Writer del ascensor o la Civil engineer, contracting.  Use ropa cmoda y calzado con buen soporte.  No haga ejercicio en exceso que pudiera hacer que se lastime, se sienta mareado o tenga dificultad para respirar. Dnde buscar ms informacin  Departamento de Salud y Servicios Humanos de los Estados Unidos (U.S. Department of Health and [employer]): [email or web address].at  Centros para el Control y la Prevencin de Probation officer for Disease Control and Prevention, CDC): http://www.wolf.info/ Comunquese con un mdico:  Antes de comenzar un nuevo programa de ejercicios.  Si tiene preguntas o inquietudes acerca de su peso.  Si tiene un problema mdico que Producer, television/film/video. Obtenga ayuda de inmediato si presenta alguno de los siguientes problemas al hacer ejercicio:  Lesiones.  Mareos.  Dificultad para respirar o falta de aire que no desaparecen al dejar de hacer ejercicio.  Dolor en el pecho.  Latidos cardacos rpidos. Resumen  El sobrepeso aumenta el riesgo de tener enfermedad cardaca, accidente cerebrovascular, diabetes, presin arterial alta y varios tipos de cncer.  Para perder peso debe quemar ms caloras que las que consume.  Reducir la cantidad de caloras que consume, adems de  hacer ejercicio de intensidad moderada o vigorosa todas las [place], [place] a Administrator, Civil Service. Esta informacin no tiene Marine scientist el consejo del mdico. Asegrese de hacerle al mdico cualquier pregunta que tenga. Document Released: 03/26/2010 Document Revised: 02/17/2017 Document Reviewed: 02/17/2017 Elsevier Patient Education  2020 [employer].

## 2018-07-11 ENCOUNTER — Other Ambulatory Visit: Payer: Self-pay | Admitting: Family Medicine

## 2018-07-11 ENCOUNTER — Other Ambulatory Visit: Payer: Self-pay | Admitting: *Deleted

## 2018-07-11 DIAGNOSIS — Z20822 Contact with and (suspected) exposure to covid-19: Secondary | ICD-10-CM

## 2018-07-11 DIAGNOSIS — R6889 Other general symptoms and signs: Secondary | ICD-10-CM | POA: Diagnosis not present

## 2018-07-11 LAB — BASIC METABOLIC PANEL
BUN/Creatinine Ratio: 14 (ref 9–20)
BUN: 16 mg/dL (ref 6–24)
CO2: 24 mmol/L (ref 20–29)
Calcium: 9.2 mg/dL (ref 8.7–10.2)
Chloride: 103 mmol/L (ref 96–106)
Creatinine, Ser: 1.15 mg/dL (ref 0.76–1.27)
GFR calc Af Amer: 82 mL/min/{1.73_m2} (ref >59)
GFR calc non Af Amer: 71 mL/min/{1.73_m2} (ref >59)
Glucose: 107 mg/dL — ABNORMAL HIGH (ref 65–99)
Potassium: 4.7 mmol/L (ref 3.5–5.2)
Sodium: 140 mmol/L (ref 134–144)

## 2018-07-11 LAB — HEMOGLOBIN A1C
Est. average glucose Bld gHb Est-mCnc: 128 mg/dL
Hgb A1c MFr Bld: 6.1 % — ABNORMAL HIGH (ref 4.8–5.6)

## 2018-07-11 LAB — LIPID PANEL
Chol/HDL Ratio: 4.1 ratio (ref 0.0–5.0)
Cholesterol, Total: 206 mg/dL — ABNORMAL HIGH (ref 100–199)
HDL: 50 mg/dL (ref >39)
LDL Calculated: 117 mg/dL — ABNORMAL HIGH (ref 0–99)
Triglycerides: 193 mg/dL — ABNORMAL HIGH (ref 0–149)
VLDL Cholesterol Cal: 39 mg/dL (ref 5–40)

## 2018-07-11 NOTE — Telephone Encounter (Signed)
Cholesterol remains elevated and now triglycerides are elevate. I would like to trial fenofibrate 145 mg once daily as he had an intolerance to statin therapy. The fish oil alone is not s significantly improving cholesterol levels.  A1c remains relatively the same it has decreased 1/10 of a percent to 6.1.

## 2018-07-16 LAB — NOVEL CORONAVIRUS, NAA: SARS-CoV-2, NAA: NOT DETECTED

## 2018-07-16 NOTE — Telephone Encounter (Signed)
Left voice mail to call back 

## 2018-07-17 ENCOUNTER — Telehealth: Payer: Self-pay | Admitting: Hematology

## 2018-07-17 NOTE — Telephone Encounter (Signed)
Pt is aware covid 19 negative

## 2018-07-18 ENCOUNTER — Telehealth: Payer: Self-pay | Admitting: Family Medicine

## 2018-07-18 NOTE — Telephone Encounter (Signed)
Pt could not contact [facility] stating nobody picks up the phone, please call back with lab results from 07/10/2018 as soon as possible

## 2018-07-18 NOTE — Telephone Encounter (Signed)
Left voicemail to call back(Juliana [phone]).

## 2018-07-19 MED ORDER — FENOFIBRATE 145 MG PO TABS: 145.0000 mg | ORAL_TABLET | Freq: Every day | ORAL | 3 refills | Status: DC

## 2018-07-19 NOTE — Telephone Encounter (Signed)
Patient notified of lab results & recommendations. Prescription sent to [facility] on file.

## 2019-01-09 ENCOUNTER — Telehealth: Payer: Self-pay

## 2019-01-09 NOTE — Telephone Encounter (Signed)
Called patient to do their pre-visit COVID screening.  Have you tested positive for COVID or are you currently waiting for COVID test results? no  Have you recently traveled internationally([place], [place], [place], [place], [place]) or within the [place] to a hotspot area([place], [place], [place], [place], [place])? no  Are you currently experiencing any of the following symptoms: fever, cough, SHOB, fatigue, body aches, loss of smell/taste, rash, diarrhea, vomiting, severe headaches, weakness, sore throat? no  Have you been in contact with anyone who has recently travelled? no  Have you been in contact with anyone who is experiencing any of the above symptoms or been diagnosed with COVID  or works in or has recently visited a SNF? no  Asked patient to come fasting for repeat labs

## 2019-01-10 ENCOUNTER — Ambulatory Visit (INDEPENDENT_AMBULATORY_CARE_PROVIDER_SITE_OTHER): Payer: BLUE CROSS/BLUE SHIELD

## 2019-01-10 ENCOUNTER — Ambulatory Visit (INDEPENDENT_AMBULATORY_CARE_PROVIDER_SITE_OTHER): Payer: BLUE CROSS/BLUE SHIELD | Admitting: Family Medicine

## 2019-01-10 ENCOUNTER — Other Ambulatory Visit: Payer: Self-pay

## 2019-01-10 VITALS — BP 149/97 | HR 68 | Temp 97.3°F | Resp 17 | Wt 191.0 lb

## 2019-01-10 DIAGNOSIS — M25462 Effusion, left knee: Secondary | ICD-10-CM

## 2019-01-10 DIAGNOSIS — R7303 Prediabetes: Secondary | ICD-10-CM

## 2019-01-10 DIAGNOSIS — Z1159 Encounter for screening for other viral diseases: Secondary | ICD-10-CM

## 2019-01-10 DIAGNOSIS — G8929 Other chronic pain: Secondary | ICD-10-CM

## 2019-01-10 DIAGNOSIS — E782 Mixed hyperlipidemia: Secondary | ICD-10-CM

## 2019-01-10 DIAGNOSIS — Z1211 Encounter for screening for malignant neoplasm of colon: Secondary | ICD-10-CM

## 2019-01-10 DIAGNOSIS — S43422D Sprain of left rotator cuff capsule, subsequent encounter: Secondary | ICD-10-CM

## 2019-01-10 DIAGNOSIS — M25562 Pain in left knee: Secondary | ICD-10-CM

## 2019-01-10 DIAGNOSIS — I10 Essential (primary) hypertension: Secondary | ICD-10-CM

## 2019-01-10 NOTE — Patient Instructions (Addendum)
Plan de alimentacin DASH DASH Eating Plan DASH es la sigla en ingls de "Enfoques Alimentarios para Detener la Hipertensin" (Dietary Approaches to Stop Hypertension). El plan de alimentacin DASH ha demostrado bajar la presin arterial elevada (hipertensin). Tambin puede reducir el riesgo de diabetes tipo 2, enfermedad cardaca y accidente cerebrovascular. Este plan tambin puede ayudar a adelgazar. Consejos para seguir este plan  Pautas generales  Evite ingerir ms de 2,300 mg (miligramos) de sal (sodio) por da. Si tiene hipertensin, es posible que necesite reducir la ingesta de sodio a 1,500 mg por da.  Limite el consumo de alcohol a no ms de 1medida por da si es mujer y no est embarazada, y 2medidas por da si es hombre. Una medida equivale a 12oz (355ml) de cerveza, 5oz (148ml) de vino o 1oz (44ml) de bebidas alcohlicas de alta graduacin.  Trabaje con su mdico para mantener un peso saludable o perder peso. Pregntele cul es el peso recomendado para usted.  Realice al menos 30 minutos de ejercicio que haga que se acelere su corazn (ejercicio aerbico) la mayora de los das de la semana. Estas actividades pueden incluir caminar, nadar o andar en bicicleta.  Trabaje con su mdico o especialista en alimentacin y nutricin (nutricionista) para ajustar su plan alimentario a sus necesidades calricas personales. Lectura de las etiquetas de los alimentos   Verifique en las etiquetas de los alimentos, la cantidad de sodio por porcin. Elija alimentos con menos del 5 por ciento del valor diario de sodio. Generalmente, los alimentos con menos de 300 mg de sodio por porcin se encuadran dentro de este plan alimentario.  Para encontrar cereales integrales, busque la palabra "integral" como primera palabra en la lista de ingredientes. De compras  Compre productos en los que en su etiqueta diga: "bajo contenido de sodio" o "sin agregado de sal".  Compre alimentos frescos.  Evite los alimentos enlatados y comidas precocidas o congeladas. Coccin  Evite agregar sal cuando cocine. Use hierbas o aderezos sin sal, en lugar de sal de mesa o sal marina. Consulte al mdico o farmacutico antes de usar sustitutos de la sal.  No fra los alimentos. A la hora de cocinar los alimentos opte por hornearlos, hervirlos, grillarlos y asarlos a la parrilla.  Cocine con aceites cardiosaludables, como oliva, canola, soja o girasol. Planificacin de las comidas  Consuma una dieta equilibrada, que incluya lo siguiente: ? 5o ms porciones de frutas y verduras por da. Trate de que la mitad del plato de cada comida sean frutas y verduras. ? Hasta 6 u 8 porciones de cereales integrales por da. ? Menos de 6 onzas de carne, aves o pescado magros por da. Una porcin de 3 onzas de carne tiene casi el mismo tamao que un mazo de cartas. Un huevo equivale a 1 onza. ? Dos porciones de productos lcteos descremados por da. ? Una porcin de frutos secos, semillas o frijoles 5 veces por semana. ? Grasas cardiosaludables. Las grasas saludables llamadas cidos grasos omega-3 se encuentran en alimentos como semillas de lino y pescados de agua fra, como por ejemplo, sardinas, salmn y caballa.  Limite la cantidad que ingiere de los siguientes alimentos: ? Alimentos enlatados o envasados. ? Alimentos con alto contenido de grasa trans, como alimentos fritos. ? Alimentos con alto contenido de grasa saturada, como carne con grasa. ? Dulces, postres, bebidas azucaradas y otros alimentos con azcar agregada. ? Productos lcteos enteros.  No le agregue sal a los alimentos antes de probarlos.  Trate de comer   al menos 2 comidas vegetarianas por semana.  Consuma ms comida casera y menos de restaurante, de bufs y comida rpida.  Cuando coma en un restaurante, pida que preparen su comida con menos sal o, en lo posible, sin nada de sal. Qu alimentos se recomiendan? Los alimentos enumerados a  continuacin no constituyen una lista completa. Hable con el nutricionista sobre las mejores opciones alimenticias para usted. Cereales Pan de salvado o integral. Pasta de salvado o integral. Arroz integral. Avena. Quinua. Trigo burgol. Cereales integrales y con bajo contenido de sodio. Pan pita. Galletitas de agua con bajo contenido de grasa y sodio. Tortillas de harina integral. Verduras Verduras frescas o congeladas (crudas, al vapor, asadas o grilladas). Jugos de tomate y verduras con bajo contenido de sodio o reducidos en sodio. Salsa y pasta de tomate con bajo contenido de sodio o reducidas en sodio. Verduras enlatadas con bajo contenido de sodio o reducidas en sodio. Frutas Todas las frutas frescas, congeladas o disecadas. Frutas enlatadas en jugo natural (sin agregado de azcar). Carne y otros alimentos proteicos Pollo o pavo sin piel. Carne de pollo o de pavo molida. Cerdo desgrasado. Pescado y mariscos. Claras de huevo. Porotos, guisantes o lentejas secos. Frutos secos, mantequilla de frutos secos y semillas sin sal. Frijoles enlatados sin sal. Cortes de carne vacuna magra, desgrasada. Embutidos magros, con bajo contenido de sodio. Lcteos Leche descremada (1%) o descremada. Quesos sin grasa, con bajo contenido de grasa o descremados. Queso blanco o ricota sin grasa, con bajo contenido de sodio. Yogur semidescremado o descremado. Queso con bajo contenido de grasa y sodio. Grasas y aceites Margarinas untables que no contengan grasas trans. Aceite vegetal. [place] y aderezos para ensaladas livianos o con bajo contenido de grasas (reducidos en sodio). Aceite de canola, crtamo, oliva, soja y girasol. [place]. Condimentos y otros alimentos Hierbas. Especias. Mezclas de condimentos sin sal. Palomitas de maz y pretzels sin sal. Dulces con bajo contenido de grasas. Qu alimentos no se recomiendan? Los alimentos enumerados a continuacin no constituyen una lista completa. Hable con el  nutricionista sobre las mejores opciones alimenticias para usted. Cereales Productos de panificacin hechos con grasa, como medialunas, magdalenas y algunos panes. Comidas con arroz o pasta seca listas para usar. Verduras Verduras con crema o fritas. Verduras en salsa de queso. Verduras enlatadas regulares (que no sean con bajo contenido de sodio o reducidas en sodio). Pasta y salsa de tomates enlatadas regulares (que no sean con bajo contenido de sodio o reducidas en sodio). Jugos de tomate y verduras regulares (que no sean con bajo contenido de sodio o reducidos en sodio). Pepinillos. Aceitunas. Frutas Fruta enlatada en almbar liviano o espeso. Frutas cocidas en aceite. Frutas con salsa de crema o manteca. Carne y otros alimentos proteicos Cortes de carne con grasa. Costillas. Carne frita. Tocino. Salchichas. Mortadela y otras carnes procesadas. Salame. Panceta. Perros calientes (hotdogs). Salchicha de cerdo. Frutos secos y semillas con sal. Frijoles enlatados con agregado de sal. Pescado enlatado o ahumado. Huevos enteros o yemas. Pollo o pavo con piel. Lcteos Leche entera o al 2%, crema y mitad leche y mitad crema. Queso crema entero o con toda su grasa. Yogur entero o endulzado. Quesos con toda su grasa. Sustitutos de cremas no lcteas. Coberturas batidas. Quesos para untar y quesos procesados. Grasas y aceites Mantequilla. Margarina en barra. Manteca de cerdo. Materia grasa. Mantequilla clarificada. Grasa de panceta. Aceites tropicales como aceite de coco, palmiste o palma. Condimentos y otros alimentos Palomitas de maz y pretzels con sal. Sal   de cebolla, sal de ajo, sal condimentada, sal de mesa y sal marina. Salsa [place]. Salsa trtara. Salsa barbacoa. Salsa teriyaki. Salsa de soja, incluso la que tiene contenido reducido de [place]. Salsa de carne. Salsas en lata y envasadas. Salsa de pescado. Salsa de [place]. Salsa rosada. Rbano picante envasado. [place]. Mostaza. Saborizantes y  tiernizantes para carne. Caldo en cubitos. Salsa picante y salsa tabasco. Escabeches envasados o ya preparados. Aderezos para tacos prefabricados o envasados. Salsas. Aderezos comunes para ensalada. Dnde encontrar ms informacin:  [place], los Pulmones y Herbalist (National Heart, Lung, and [place]): https://wilson-eaton.com/  Asociacin [place]adounidense del [place]n (American Heart Association): www.heart.org Resumen  El plan de alimentacin DASH ha demostrado bajar la presin arterial elevada (hipertensin). Tambin puede reducir [employer] de diabetes tipo 2, enfermedad cardaca y accidente cerebrovascular.  Con el plan de alimentacin DASH, deber limitar el consumo de sal (sodio) a 2,300 mg por da. Si tiene hipertensin, es posible que necesite reducir la ingesta de sodio a 1,500 mg por da.  Cuando siga el plan de alimentacin DASH, trate de comer ms frutas frescas y verduras, cereales integrales, carnes magras, lcteos descremados y grasas cardiosaludables.  Trabaje con su mdico o especialista en alimentacin y nutricin (nutricionista) para ajustar su plan alimentario a sus necesidades calricas personales. Esta informacin no tiene Marine scientist el consejo del mdico. Asegrese de hacerle al mdico cualquier pregunta que tenga. Document Revised: 04/11/2016 Document Reviewed: 04/11/2016 Elsevier Patient Education  [place] Cuff Tendinitis  Rotator cuff tendinitis is inflammation of the tough, cord-like bands that connect muscle to bone (tendons) in the rotator cuff. The rotator cuff includes all of the muscles and tendons that connect the arm to the shoulder. The rotator cuff holds the head of the upper arm bone (humerus) in the cup (fossa) of the shoulder blade (scapula). This condition can lead to a long-lasting (chronic) tear. The tear may be partial or complete. What are the causes? This condition is usually caused by overusing the  rotator cuff. What increases the risk? This condition is more likely to develop in athletes and workers who frequently use their shoulder or reach over their heads. This can include activities such as:  Tennis.  Baseball or softball.  Swimming.  Construction work.  Painting. What are the signs or symptoms? Symptoms of this condition include:  Pain spreading (radiating) from the shoulder to the upper arm.  Swelling and tenderness in front of the shoulder.  Pain when reaching, pulling, or lifting the arm above the head.  Pain when lowering the arm from above the head.  Minor pain in the shoulder when resting.  Increased pain in the shoulder at night.  Difficulty placing the arm behind the back. How is this diagnosed? This condition is diagnosed with a medical history and physical exam. Tests may also be done, including:  X-rays.  MRI.  Ultrasounds.  CT or MR arthrogram. During this test, a contrast material is injected and then images are taken. How is this treated? Treatment for this condition depends on the severity of the condition. In less severe cases, treatment may include:  Rest. This may be done with a sling that holds the shoulder still (immobilization). Your health care provider may also recommend avoiding activities that involve lifting your arm over your head.  Icing the shoulder.  Anti-inflammatory medicines, such as aspirin or ibuprofen. In more severe cases, treatment may include:  Physical therapy.  Steroid injections.  Surgery. Follow these  instructions at home: If you have a sling:  Wear the sling as told by your health care provider. Remove it only as told by your health care provider.  Loosen the sling if your fingers tingle, become numb, or turn cold and blue.  Keep the sling clean.  If the sling is not waterproof, do not let it get wet. Remove it, if allowed, or cover it with a watertight covering when you take a bath or  shower. Managing pain, stiffness, and swelling  If directed, put ice on the injured area. ? If you have a removable sling, remove it as told by your health care provider. ? Put ice in a plastic bag. ? Place a towel between your skin and the bag. ? Leave the ice on for 20 minutes, 2-3 times a day.  Move your fingers often to avoid stiffness and to lessen swelling.  Raise (elevate) the injured area above the level of your heart while you are lying down.  Find a comfortable sleeping position or sleep on a recliner, if available. Driving  Do not drive or use heavy machinery while taking prescription pain medicine.  Ask your health care provider when it is safe to drive if you have a sling on your arm. Activity  Rest your shoulder as told by your health care provider.  Return to your normal activities as told by your health care provider. Ask your health care provider what activities are safe for you.  Do any exercises or stretches as told by your health care provider.  If you do repetitive overhead tasks, take small breaks in between and include stretching exercises as told by your health care provider. General instructions  Do not use any products that contain nicotine or tobacco, such as cigarettes and e-cigarettes. These can delay healing. If you need help quitting, ask your health care provider.  Take over-the-counter and prescription medicines only as told by your health care provider.  Keep all follow-up visits as told by your health care provider. This is important. Contact a health care provider if:  Your pain gets worse.  You have new pain in your arm, hands, or fingers.  Your pain is not relieved with medicine or does not get better after 6 weeks of treatment.  You have cracking sensations when moving your shoulder in certain directions.  You hear a snapping sound after using your shoulder, followed by severe pain and weakness. Get help right away if:  Your arm,  hand, or fingers are numb or tingling.  Your arm, hand, or fingers are swollen or painful or they turn white or blue. Summary  Rotator cuff tendinitis is inflammation of the tough, cord-like bands that connect muscle to bone (tendons) in the rotator cuff.  This condition is usually caused by overusing the rotator cuff, which includes all of the muscles and tendons that connect the arm to the shoulder.  This condition is more likely to develop in athletes and workers who frequently use their shoulder or reach over their heads.  Treatment generally includes rest, anti-inflammatory medicines, and icing. In some cases, physical therapy and steroid injections may be needed. In severe cases, surgery may be needed. This information is not intended to replace advice given to you by your health care provider. Make sure you discuss any questions you have with your health care provider. Document Revised: 04/13/2018 Document Reviewed: 12/07/2015 Elsevier Patient Education  [place].

## 2019-01-10 NOTE — Progress Notes (Signed)
Subjective:  Patient ID: Steve Drake, male    DOB: 01/08/1965  Age: 54 y.o. MRN: [id number]  CC: Prediabetes and Hyperlipidemia (patient is fasting)   HPI Steve Drake is a 54 year old right-handed male with a history of prediabetes (A1c 6.1), hyperlipidemia (unable to tolerate statin due to myalgias) who presents for a follow-up visit.  He complains of L shoulder pain worse at night; pain is absent during the day. Denies tingling or reduced L arm strength. 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 Pain is described as 12/10 at night. Currently not using analgesics and declines medications because he is wary of side effects.  L knee also hurts with intermittent swelling.  He gives a history of trauma several years ago and informs me that [place] x-ray which showed " something was broken".  He would like a repeat x-ray.  His blood pressure is elevated at 149/97 and was 145/88 at his last visit in 07/2018.  Past Medical History:  Diagnosis Date  . Mixed hyperlipidemia 01/11/2019  . Prediabetes 01/11/2019     Family History  Problem Relation Age of Onset  . Diabetes Mother   . Early death Father   . Hypertension Brother     Allergies  Allergen Reactions  . Almond (Diagnostic)     Throat swelling  . Apple     Throat swelling    Outpatient Medications Prior to Visit  Medication Sig Dispense Refill  . metFORMIN (GLUCOPHAGE) 500 MG tablet Take 1 tablet (500 mg total) by mouth 2 (two) times daily with a meal. 180 tablet 3  . Omega-3 Fatty Acids (FISH OIL) 1000 MG CPDR Take 1,000 mg by mouth 2 (two) times daily. 60 capsule 2  . fenofibrate (TRICOR) 145 MG tablet Take 1 tablet (145 mg total) by mouth daily. 30 tablet 3   No facility-administered medications prior to visit.     ROS Review of Systems  Constitutional: Negative for activity change and appetite change.  HENT: Negative for sinus  pressure and sore throat.   Eyes: Negative for visual disturbance.  Respiratory: Negative for cough, chest tightness and shortness of breath.   Cardiovascular: Negative for chest pain and leg swelling.  Gastrointestinal: Negative for abdominal distention, abdominal pain, constipation and diarrhea.  Endocrine: Negative.   Genitourinary: Negative for dysuria.  Musculoskeletal:       See HPI  Skin: Negative for rash.  Allergic/Immunologic: Negative.   Neurological: Negative for weakness, light-headedness and numbness.  Psychiatric/Behavioral: Negative for dysphoric mood and suicidal ideas.    Objective:  BP (!) 149/97   Pulse 68   Temp (!) 97.3 F (36.3 C) (Temporal)   Resp 17   Wt 191 lb (86.6 kg)   SpO2 97%   BMI 29.04 kg/m   BP/Weight 01/10/2019 [id number]  Systolic BP 123456 [id number]  Diastolic BP 97 88 88  Wt. (Lbs) 191 191.6 184.4  BMI 29.04 29.13 28.04      Physical Exam Constitutional:      Appearance: He is well-developed.  Neck:     Vascular: No JVD.  Cardiovascular:     Rate and Rhythm: Normal rate.     Heart sounds: Normal heart sounds. No murmur.  Pulmonary:     Effort: Pulmonary effort is normal.     Breath sounds: Normal breath sounds. No wheezing or rales.  Chest:  Chest wall: No tenderness.  Abdominal:     General: Bowel sounds are normal. There is no distension.     Palpations: Abdomen is soft. There is no mass.     Tenderness: There is no abdominal tenderness.  Musculoskeletal:     Right lower leg: No edema.     Left lower leg: No edema.     Comments: Normal appearance of both shoulders. Normal range of motion of left shoulder; negative Hawkins, Neer sign Normal handgrip bilaterally Both knees appear normal Slight edema of left knee, minimal tenderness on range of motion  Neurological:     Mental Status: He is alert and oriented to person, place, and time.  Psychiatric:        Mood and Affect: Mood normal.     CMP Latest Ref Rng &  Units 07/10/2018 12/21/2017 11/17/2009  Glucose 65 - 99 mg/dL 107(H) 109(H) 91  BUN 6 - 24 mg/dL 16 16 19   Creatinine 0.76 - 1.27 mg/dL 1.15 1.15 1.25  Sodium 134 - 144 mmol/L 140 140 141  Potassium 3.5 - 5.2 mmol/L 4.7 4.9 4.5  Chloride 96 - 106 mmol/L 103 102 104  CO2 20 - 29 mmol/L 24 23 27   Calcium 8.7 - 10.2 mg/dL 9.2 9.9 9.5  Total Protein 6.0 - 8.5 g/dL - 7.5 7.3  Total Bilirubin 0.0 - 1.2 mg/dL - 0.5 0.7  Alkaline Phos 39 - 117 IU/L - 70 56  AST 0 - 40 IU/L - 18 18  ALT 0 - 44 IU/L - 18 22    Lipid Panel     Component Value Date/Time   CHOL 206 (H) 07/10/2018 1032   TRIG 193 (H) 07/10/2018 1032   HDL 50 07/10/2018 1032   CHOLHDL 4.1 07/10/2018 1032   CHOLHDL 5.7 Ratio 11/17/2009 2241   VLDL 51 (H) 11/17/2009 2241   LDLCALC 117 (H) 07/10/2018 1032    CBC    Component Value Date/Time   WBC 6.6 12/21/2017 1128   RBC 5.47 12/21/2017 1128   HGB 16.1 12/21/2017 1128   HCT 49.0 12/21/2017 1128   PLT 316 12/21/2017 1128   MCV 90 12/21/2017 1128   MCH 29.4 12/21/2017 1128   MCHC 32.9 12/21/2017 1128   RDW 11.7 (L) 12/21/2017 1128   LYMPHSABS 2.5 12/21/2017 1128   EOSABS 0.1 12/21/2017 1128   BASOSABS 0.1 12/21/2017 1128    Lab Results  Component Value Date   HGBA1C 6.1 (H) 07/10/2018    Assessment & Plan:   1. Prediabetes Controlled with A1c of 6.1 Continue Metformin, lifestyle modifications - Hemoglobin A1c  2. Mixed hyperlipidemia Uncontrolled Unable to tolerate statin Counseled on low-cholesterol diet, lifestyle modifications - Lipid Panel  3. Screening for colon cancer - Ambulatory referral to Gastroenterology  4. Screening for viral disease - Hepatitis C Antibody - HIV antibody (with reflex)  5. Sprain of left rotator cuff capsule, subsequent encounter He declines initiation of medication due to side effects We will refer to PT - Ambulatory referral to Physical Therapy  6. Chronic pain of left knee Due to previous trauma Declines  initiation of medications as he is wary of side effects - DG Knee Complete 4 Views Left; Future - Ambulatory referral to Physical Therapy  7. Essential hypertension Blood pressure is elevated and he declines initiation of medications due to medication side effect Counseled on adverse effects of untreated hypertension Counseled on blood pressure goal of less than 130/80, low-sodium, DASH diet, medication compliance, 150 minutes of moderate  intensity exercise per week. Discussed medication compliance, adverse effects.       Charlott Rakes, MD, FAAFP. [facility] [place], [place]   01/10/2019, 9:26 AM

## 2019-01-11 ENCOUNTER — Encounter: Payer: Self-pay | Admitting: Family Medicine

## 2019-01-11 DIAGNOSIS — E782 Mixed hyperlipidemia: Secondary | ICD-10-CM

## 2019-01-11 DIAGNOSIS — R7303 Prediabetes: Secondary | ICD-10-CM

## 2019-01-11 HISTORY — DX: Mixed hyperlipidemia: E78.2

## 2019-01-11 HISTORY — DX: Prediabetes: R73.03

## 2019-01-11 LAB — HIV ANTIBODY (ROUTINE TESTING W REFLEX): HIV Screen 4th Generation wRfx: NONREACTIVE

## 2019-01-11 LAB — LIPID PANEL
Chol/HDL Ratio: 4.8 ratio (ref 0.0–5.0)
Cholesterol, Total: 225 mg/dL — ABNORMAL HIGH (ref 100–199)
HDL: 47 mg/dL (ref >39)
LDL Chol Calc (NIH): 145 mg/dL — ABNORMAL HIGH (ref 0–99)
Triglycerides: 184 mg/dL — ABNORMAL HIGH (ref 0–149)
VLDL Cholesterol Cal: 33 mg/dL (ref 5–40)

## 2019-01-11 LAB — HEMOGLOBIN A1C
Est. average glucose Bld gHb Est-mCnc: 134 mg/dL
Hgb A1c MFr Bld: 6.3 % — ABNORMAL HIGH (ref 4.8–5.6)

## 2019-01-11 LAB — HEPATITIS C ANTIBODY: Hep C Virus Ab: <0.1 s/co ratio (ref 0.0–0.9)

## 2019-01-14 ENCOUNTER — Telehealth: Payer: Self-pay | Admitting: Family Medicine

## 2019-01-14 MED ORDER — AMLODIPINE BESYLATE 5 MG PO TABS: 5.0000 mg | ORAL_TABLET | Freq: Every day | ORAL | 6 refills | Status: DC

## 2019-01-14 NOTE — Telephone Encounter (Signed)
Prescription sent to pharmacy.

## 2019-01-14 NOTE — Telephone Encounter (Signed)
You saw Mr  Steve Drake Thursday on PCE and you suggest him to take Bp medicine but they denied now he wants the rx his Bp is high now  if you can sent it to  [place] at [facility] . Thank you

## 2019-01-22 ENCOUNTER — Encounter: Payer: Self-pay | Admitting: Gastroenterology

## 2019-03-29 ENCOUNTER — Encounter: Payer: Self-pay | Admitting: Gastroenterology

## 2019-04-12 ENCOUNTER — Ambulatory Visit: Payer: Self-pay | Attending: Internal Medicine

## 2019-04-12 DIAGNOSIS — Z23 Encounter for immunization: Secondary | ICD-10-CM

## 2019-04-12 NOTE — Progress Notes (Signed)
   Covid-19 Vaccination Clinic  Name:  Steve Drake    MRN: [id number] DOB: 10/04/1965  04/12/2019  [name] was observed post Covid-19 immunization for 15 minutes without incident. He was provided with Vaccine Information Sheet and instruction to access the V-Safe system.   [name] was instructed to call 911 with any severe reactions post vaccine: Marland Kitchen Difficulty breathing  . Swelling of face and throat  . A fast heartbeat  . A bad rash all over body  . Dizziness and weakness   Immunizations Administered    Name Date Dose VIS Date Route   Pfizer COVID-19 Vaccine 04/12/2019  9:54 AM 0.3 mL 12/14/2018 Intramuscular   Manufacturer: [place]   Lot: [id number]   [place]: [id number]

## 2019-04-22 ENCOUNTER — Encounter: Payer: Self-pay | Admitting: Gastroenterology

## 2019-04-22 ENCOUNTER — Telehealth: Payer: Self-pay | Admitting: *Deleted

## 2019-04-22 NOTE — Telephone Encounter (Signed)
Pt was reschedule by Barbie Haggis.

## 2019-04-22 NOTE — Telephone Encounter (Signed)
Thelma Barge, [place]  Rayven Hendrickson, Geronimo Running, RN  Hi Robin,   I called him but he did not answer and his voice mail was full.   Cathy       Previous Messages   ----- Message -----  From: Levonne Spiller, RN  Sent: 04/22/2019 10:16 AM EDT  To: Sim Boast   Hey will you call this patient and reschedule them? They no show PV today. Thanks

## 2019-04-29 ENCOUNTER — Encounter: Payer: Self-pay | Admitting: Gastroenterology

## 2019-05-05 ENCOUNTER — Emergency Department (HOSPITAL_COMMUNITY): Payer: 59

## 2019-05-05 ENCOUNTER — Emergency Department (HOSPITAL_COMMUNITY)
Admission: EM | Admit: 2019-05-05 | Discharge: 2019-05-06 | Disposition: A | Discharge status: Home / Self Care | Payer: 59 | Attending: Emergency Medicine | Admitting: Emergency Medicine

## 2019-05-05 ENCOUNTER — Other Ambulatory Visit: Payer: Self-pay

## 2019-05-05 ENCOUNTER — Encounter (HOSPITAL_COMMUNITY): Payer: Self-pay | Admitting: Emergency Medicine

## 2019-05-05 DIAGNOSIS — Z79899 Other long term (current) drug therapy: Secondary | ICD-10-CM | POA: Diagnosis not present

## 2019-05-05 DIAGNOSIS — Z20822 Contact with and (suspected) exposure to covid-19: Secondary | ICD-10-CM | POA: Diagnosis not present

## 2019-05-05 DIAGNOSIS — Y939 Activity, unspecified: Secondary | ICD-10-CM | POA: Insufficient documentation

## 2019-05-05 DIAGNOSIS — R0602 Shortness of breath: Secondary | ICD-10-CM | POA: Insufficient documentation

## 2019-05-05 DIAGNOSIS — S46912A Strain of unspecified muscle, fascia and tendon at shoulder and upper arm level, left arm, initial encounter: Secondary | ICD-10-CM | POA: Insufficient documentation

## 2019-05-05 DIAGNOSIS — Y929 Unspecified place or not applicable: Secondary | ICD-10-CM | POA: Diagnosis not present

## 2019-05-05 DIAGNOSIS — X58XXXA Exposure to other specified factors, initial encounter: Secondary | ICD-10-CM | POA: Insufficient documentation

## 2019-05-05 DIAGNOSIS — Y999 Unspecified external cause status: Secondary | ICD-10-CM | POA: Diagnosis not present

## 2019-05-05 DIAGNOSIS — Z87891 Personal history of nicotine dependence: Secondary | ICD-10-CM | POA: Diagnosis not present

## 2019-05-05 LAB — D-DIMER, QUANTITATIVE: D-Dimer, Quant: 0.3 ug/mL-FEU (ref 0.00–0.50)

## 2019-05-05 LAB — BASIC METABOLIC PANEL
Anion gap: 10 (ref 5–15)
BUN: 14 mg/dL (ref 6–20)
CO2: 25 mmol/L (ref 22–32)
Calcium: 9.2 mg/dL (ref 8.9–10.3)
Chloride: 101 mmol/L (ref 98–111)
Creatinine, Ser: 1.26 mg/dL — ABNORMAL HIGH (ref 0.61–1.24)
GFR calc Af Amer: >60 mL/min (ref >60)
GFR calc non Af Amer: >60 mL/min (ref >60)
Glucose, Bld: 203 mg/dL — ABNORMAL HIGH (ref 70–99)
Potassium: 4.5 mmol/L (ref 3.5–5.1)
Sodium: 136 mmol/L (ref 135–145)

## 2019-05-05 LAB — CBC
HCT: 48.1 % (ref 39.0–52.0)
Hemoglobin: 15.8 g/dL (ref 13.0–17.0)
MCH: 29.2 pg (ref 26.0–34.0)
MCHC: 32.8 g/dL (ref 30.0–36.0)
MCV: 88.7 fL (ref 80.0–100.0)
Platelets: 282 10*3/uL (ref 150–400)
RBC: 5.42 MIL/uL (ref 4.22–5.81)
RDW: 11.8 % (ref 11.5–15.5)
WBC: 8.9 10*3/uL (ref 4.0–10.5)
nRBC: 0 % (ref 0.0–0.2)

## 2019-05-05 LAB — RESPIRATORY PANEL BY RT PCR (FLU A&B, COVID)
Influenza A by PCR: NEGATIVE
Influenza B by PCR: NEGATIVE
SARS Coronavirus 2 by RT PCR: NEGATIVE

## 2019-05-05 LAB — TROPONIN I (HIGH SENSITIVITY)
Troponin I (High Sensitivity): <2 ng/L (ref <18)
Troponin I (High Sensitivity): <2 ng/L (ref <18)

## 2019-05-05 LAB — BRAIN NATRIURETIC PEPTIDE: B Natriuretic Peptide: 20.5 pg/mL (ref 0.0–100.0)

## 2019-05-05 MED ORDER — METHOCARBAMOL 500 MG PO TABS
500.0000 mg | ORAL_TABLET | Freq: Two times a day (BID) | ORAL | 0 refills | Status: DC
Start: 2019-05-05 — End: 2019-05-20

## 2019-05-05 MED ORDER — SODIUM CHLORIDE 0.9% FLUSH
3.0000 mL | Freq: Once | INTRAVENOUS | Status: AC
  Administered 2019-05-05: 21:00:00 3 mL via INTRAVENOUS

## 2019-05-05 MED ORDER — SODIUM CHLORIDE 0.9 % IV BOLUS
500.0000 mL | Freq: Once | INTRAVENOUS | Status: AC
  Administered 2019-05-05: 500 mL via INTRAVENOUS

## 2019-05-05 NOTE — Discharge Instructions (Addendum)
You have been diagnosed today with muscle strain of left shoulder, shortness of breath.  At this time there does not appear to be the presence of an emergent medical condition, however there is always the potential for conditions to change. Please read and follow the below instructions.  Please return to the Emergency Department immediately for any new or worsening symptoms. Please be sure to follow up with your Primary Care Provider within one week regarding your visit today; please call their office to schedule an appointment even if you are feeling better for a follow-up visit. You may use the muscle relaxer Robaxin as prescribed to help with your symptoms.  Do not drive or operate heavy machinery while taking Robaxin as it will make you drowsy.  Do not drink alcohol or take other sedating medications while taking Robaxin as this will worsen side effects. You may continue using the sling provided to you today to help with your left shoulder pain.  Please call the orthopedic specialist Dr. Alvan Dame on your discharge paperwork for further evaluation.  Get help right away if: Your arm, hand, or fingers: Tingle. Are numb. Are swollen. Are painful. Turn white or blue. Your shortness of breath gets worse. You have chest pain You have trouble breathing when you are resting. You feel light-headed or you pass out (faint). You have a cough that is not helped by medicines. You cough up blood. You have pain with breathing. You have pain in your chest, arms, shoulders, or belly (abdomen). You have a fever. You cannot walk up stairs. You cannot exercise the way you normally do. You have any new/concerning or worsening of symptoms  Please read the additional information packets attached to your discharge summary.  Do not take your medicine if  develop an itchy rash, swelling in your mouth or lips, or difficulty breathing; call 911 and seek immediate emergency medical attention if this occurs.  Note:  Portions of this text may have been transcribed using voice recognition software. Every effort was made to ensure accuracy; however, inadvertent computerized transcription errors may still be present. --------------- Below has been translated using Google translate.  Errors may be present.  Interpret with caution.  A continuacin se ha traducido con Microbiologist. Puede haber errores. Interprete con precaucin. --------------- Anson Crofts le han diagnosticado distensin muscular del hombro izquierdo, dificultad para respirar.  En este momento no parece haber la presencia de una afeccin mdica emergente, sin embargo, siempre existe la posibilidad de que las afecciones [place] instrucciones a continuacin.  1. Regrese al Departamento de Emergencias de inmediato si tiene sntomas nuevos o que [place]. 2. Asegrese de hacer un seguimiento con su Proveedor de atencin primaria dentro de una semana con respecto a su visita de hoy; llame a su oficina para programar una cita, incluso si se siente mejor para una visita de seguimiento. 3. Puede usar el relajante muscular Robaxin segn lo prescrito para ayudar con sus sntomas. No conduzca ni maneje maquinaria pesada mientras est tomando Robaxin, ya que lo adormecer. No beba alcohol ni tome otros medicamentos sedantes mientras toma Robaxin, ya que esto [employer] secundarios. 4. Puede seguir usando el cabestrillo que se le proporcion hoy para Best boy en el hombro izquierdo. Llame al especialista en ortopedia, el Dr. Alvan Dame, en su documentacin de alta para una evaluacin adicional.  Danice Goltz ayuda de inmediato si: 1. Su brazo, mano o dedos: o Hormigueo. o Estn entumecidos. o Estn hinchados. o Son dolorosos.  o Se vuelve blanco o azul. ? Su dificultad para respirar empeora. ? Tienes dolor de pecho ? Tiene dificultad para respirar cuando est descansando. ? Se siente mareado o se desmaya (desmayo). ? Tiene tos que  no mejora con los [employer]. ? Tose sangre. ? Tiene dolor al respirar. ? Tiene dolor en el pecho, los brazos, los hombros o el abdomen (abdomen). ? Tienes fiebre. ? No puedes subir escaleras. ? No puede ejercitarse como lo hace normalmente. ? Tiene sntomas nuevos, preocupantes o que empeoran  Lea los paquetes de informacin adicional adjuntos a su resumen de alta.  No tome su medicamento si desarrolla un sarpullido con picazn, hinchazn en su boca o labios, o dificultad para respirar; Llame al 911 y busque atencin mdica de emergencia inmediata si esto ocurre.  Nota: Es posible que algunas partes de este texto se hayan transcrito con un software de reconocimiento de voz. Se hizo todo lo posible para garantizar la precisin; sin embargo, an Bar[place] errores de transcripcin computarizados inadvertidos.

## 2019-05-05 NOTE — ED Provider Notes (Addendum)
[facility] EMERGENCY DEPARTMENT Provider Note   CSN: :[id number] Arrival date & time: 05/05/19  1731     History Chief Complaint  Patient presents with  . Shortness of Breath   Spanish video interpreter used during visit. Steve Drake is a 54 y.o. male history of hyperlipidemia and prediabetes otherwise healthy.  Patient presents today for left shoulder pain and shortness of breath.  Patient reports chronic left shoulder pain for multiple months, described as a throbbing sensation worsened with shrugging the shoulder and movement, nonradiating moderate intensity improved with rest.  Steve Drake feels pain has been worse over the last 2 days, no new injury.  Steve Drake reports that over the past 2 days Steve Drake has had shortness of breath, Steve Drake describes as a feeling of trouble catching a full breath without increase in shoulder pain and without chest pain.  Steve Drake feels that his heart begins to race when Steve Drake has trouble catching his breath, this resolves gradually over a few minutes denies similar in the past.  Denies fever/chills, headache, neck pain, cough/hemoptysis, history of blood clot, abdominal pain, nausea/vomiting, diarrhea, extremity swelling/color change, numbness/tingling, weakness or any additional concerns.  HPI     Past Medical History:  Diagnosis Date  . Mixed hyperlipidemia 01/11/2019  . Prediabetes 01/11/2019    Patient Active Problem List   Diagnosis Date Noted  . Prediabetes 01/11/2019  . Mixed hyperlipidemia 01/11/2019    Past Surgical History:  Procedure Laterality Date  . HERNIA REPAIR         Family History  Problem Relation Age of Onset  . Diabetes Mother   . Early death Father   . Hypertension Brother     Social History   Tobacco Use  . Smoking status: Former Research scientist (life sciences)  . Smokeless tobacco: Never Used  Substance Use Topics  . Alcohol use: Not Currently  . Drug use: Never    Home Medications Prior to Admission medications   Medication Sig Start  Date End Date Taking? Authorizing Provider  amLODipine (NORVASC) 5 MG tablet Take 1 tablet (5 mg total) by mouth daily. 01/14/19   Charlott Rakes, MD  metFORMIN (GLUCOPHAGE) 500 MG tablet Take 1 tablet (500 mg total) by mouth 2 (two) times daily with a meal. 05/17/18   Scot Jun, FNP  methocarbamol (ROBAXIN) 500 MG tablet Take 1 tablet (500 mg total) by mouth 2 (two) times daily. 05/05/19   Nuala Alpha A, PA-C  Omega-3 Fatty Acids (FISH OIL) 1000 MG CPDR Take 1,000 mg by mouth 2 (two) times daily. 01/09/18   Scot Jun, FNP    Allergies    Almond (diagnostic) and Apple  Review of Systems   Review of Systems Ten systems are reviewed and are negative for acute change except as noted in the HPI Physical Exam Updated Vital Signs BP (!) 147/98   Pulse 70   Temp 99 F (37.2 C) (Oral)   Resp 18   Ht 5\' 7"  (1.702 m)   Wt 87.1 kg   SpO2 98%   BMI 30.07 kg/m   Physical Exam Constitutional:      General: Steve Drake is not in acute distress.    Appearance: Normal appearance. Steve Drake is well-developed. Steve Drake is not ill-appearing or diaphoretic.  HENT:     Head: Normocephalic and atraumatic.     Jaw: There is normal jaw occlusion.     Right Ear: External ear normal.     Left Ear: External ear normal.     Nose:  Nose normal.     Mouth/Throat:     Mouth: Mucous membranes are moist.     Pharynx: Oropharynx is clear.  Eyes:     General: Vision grossly intact. Gaze aligned appropriately.     Pupils: Pupils are equal, round, and reactive to light.  Neck:     Trachea: Trachea and phonation normal. No tracheal deviation.  Cardiovascular:     Rate and Rhythm: Normal rate and regular rhythm.     Pulses:          Radial pulses are 2+ on the right side and 2+ on the left side.       Dorsalis pedis pulses are 2+ on the right side and 2+ on the left side.     Heart sounds: Normal heart sounds.  Pulmonary:     Effort: Pulmonary effort is normal. No accessory muscle usage or respiratory distress.      Breath sounds: Normal breath sounds and air entry.  Chest:     Chest wall: No deformity, tenderness or crepitus.     Comments: No rash or evidence of injury Abdominal:     General: There is no distension.     Palpations: Abdomen is soft.     Tenderness: There is no abdominal tenderness. There is no guarding or rebound.  Musculoskeletal:        General: Normal range of motion.     Cervical back: Normal range of motion.     Right lower leg: No tenderness. No edema.     Left lower leg: No tenderness. No edema.     Comments: Cervical Spine: Appearance normal. No obvious bony deformity. No skin swelling, erythema, heat, fluctuance or break of the skin. No TTP over the cervical spinous processes. No step-offs. Patient is able to actively rotate their neck 45 degrees left and right voluntarily without pain and flex and extend the neck without pain.  -  Left Shoulder: Appearance normal. No obvious bony deformity. No skin swelling, erythema, heat, fluctuance or break of the skin. No clavicular deformity or TTP. TTP over anterior deltoid.  Range of motion above shoulder level limited secondary to pain.  Strength below shoulder level is intact with minimal increase in pain.  Capillary refill and sensation intact to all fingers.  Strong equal pedal pulses.  Compartments soft. - Left Elbow: Appearance normal. No obvious bony deformity. No skin swelling, erythema, heat, fluctuance or break of the skin. No TTP over joint. Active flexion, extension, supination and pronation full and intact without pain. Strength able and appropriate for age for flexion and extension.   Skin:    General: Skin is warm and dry.  Neurological:     Mental Status: Steve Drake is alert.     GCS: GCS eye subscore is 4. GCS verbal subscore is 5. GCS motor subscore is 6.     Comments: Speech is clear and goal oriented, follows commands Major Cranial nerves without deficit, no facial droop Moves extremities without ataxia, coordination  intact  Psychiatric:        Behavior: Behavior normal.     ED Results / Procedures / Treatments   Labs (all labs ordered are listed, but only abnormal results are displayed) Labs Reviewed  BASIC METABOLIC PANEL - Abnormal; Notable for the following components:      Result Value   Glucose, Bld 203 (*)    Creatinine, Ser 1.26 (*)    All other components within normal limits  RESPIRATORY PANEL BY RT PCR (FLU  A&B, COVID)  CBC  D-DIMER, QUANTITATIVE (NOT AT [facility])  BRAIN NATRIURETIC PEPTIDE  TROPONIN I (HIGH SENSITIVITY)  TROPONIN I (HIGH SENSITIVITY)    EKG EKG Interpretation  Date/Time:  Sunday May 05 2019 20:52:30 EDT Ventricular Rate:  83 PR Interval:    QRS Duration: 90 QT Interval:  349 QTC Calculation: 410 R Axis:   69 Text Interpretation: Sinus rhythm Low voltage, precordial leads Confirmed by Adam, [place] ([id number]) on 05/05/2019 10:19:00 PM   Radiology DG Chest 2 View  Result Date: 05/05/2019 CLINICAL DATA:  Shortness of breath. EXAM: CHEST - 2 VIEW COMPARISON:  None. FINDINGS: The heart size and mediastinal contours are within normal limits. The lungs are clear. No pneumothorax or pleural effusion. No acute finding in the visualized skeleton. IMPRESSION: No acute cardiopulmonary process. Electronically Signed   By: Nancy  Ballantyne M.D.   On: 05/05/2019 18:19    Procedures Procedures (including critical care time)  Medications Ordered in ED Medications  sodium chloride flush (NS) 0.9 % injection 3 mL (3 mLs Intravenous Given 05/05/19 2101)  sodium chloride 0.9 % bolus 500 mL (500 mLs Intravenous New Bag/Given 05/05/19 2152)    ED Course  I have reviewed the triage vital signs and the nursing notes.  Pertinent labs & imaging results that were available during my care of the patient were reviewed by me and considered in my medical decision making (see chart for details).    MDM Rules/Calculators/A&P                     54  year old male history of hyperlipidemia  and prediabetes presents today for what appears to be 2 separate problems.  Primary concern is left shoulder pain which has been going on for several months.  Steve Drake has tenderness over the anterior deltoid and pain is increased with range of motion above shoulder level.  Steve Drake is neurovascularly intact to the extremity strong and equal radial pulses, good capillary refill and sensation to all fingers.  There is no evidence of septic arthritis, DVT, compartment syndrome, cellulitis, grossly ligamentous laxity, bony injury or neurovascular compromise.  Steve Drake has no recent injury of the left shoulder.  Patient second concern today is palpitations and shortness of breath onset 2 days ago.  No associated chest pain, no recent infectious symptoms, rule history of blood clot or CAD. - I have ordered, reviewed and interpreted the following labs.  CBC shows no leukocytosis to suggest infection, no evidence of anemia.  BMP shows no emergent electrolyte derangement or acute kidney injury, creatinine of 1.26 appears near baseline.  High-sensitivity troponin was negative x2, no evidence of ACS as etiology of symptoms today.  D-dimer is negative doubt pulmonary embolism.  BNP within normal limits no evidence of CHF.  Covid/influenza panel negative.  EKG: Sinus rhythm Low voltage, precordial leads Confirmed by Lennice Sites [phone]) on 05/05/2019 10:19:00 PM  CXR:  IMPRESSION:  No acute cardiopulmonary process  I have personally reviewed patient's chest x-ray and agree with radiologist interpretation. - Patient reassessed resting comfortably no acute distress.  Steve Drake denies any current shortness of breath or complications.  Reports Steve Drake is feeling well.  Now that cardiopulmonary workup is reassuring strong suspicion for musculoskeletal cause of shoulder pain, this is only present when moving his shoulder, no evidence of traumatic injury. Highly doubt fracture/dislocation. No indication for emergent dedicated shoulder x-ray today  doubt fracture given history and exam, patient agreeable to outpatient follow-up with Ortho.  As for patient's shortness of breath  no evidence of ACS, PE, dissection, pneumonia, symptomatic anemia or other emergent cardiopulmonary etiology of patient's symptoms today.  Will give referral for PCP for follow-up.  Additionally musculoskeletal left shoulder pain appears separate today, will give muscle relaxers placed in sling and referred to Ortho.  At this time there does not appear to be any evidence of an acute emergency medical condition and the patient appears stable for discharge with appropriate outpatient follow up. Diagnosis was discussed with patient who verbalizes understanding of care plan and is agreeable to discharge. I have discussed return precautions with patient who verbalizes understanding. Patient encouraged to follow-up with their PCP and ortho. All questions answered.  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  Patient's case discussed with Dr. Ronnald Nian who agrees with plan to discharge with follow-up.   Spanish video interpreter used during this visit.  Note: Portions of this report may have been transcribed using voice recognition software. Every effort was made to ensure accuracy; however, inadvertent computerized transcription errors may still be present. Final Clinical Impression(s) / ED Diagnoses Final diagnoses:  Muscle strain of left shoulder, initial encounter  Shortness of breath    Rx / DC Orders ED Discharge Orders         Ordered    methocarbamol (ROBAXIN) 500 MG tablet  2 times daily     05/05/19 2354           Gari Crown 05/06/19 0002    Lennice Sites, DO 05/08/19 1742

## 2019-05-05 NOTE — ED Triage Notes (Signed)
Pt c/o left arm pain and shortness of breath x 2 days.

## 2019-05-05 NOTE — ED Notes (Signed)
Ortho tech paged  

## 2019-05-06 ENCOUNTER — Ambulatory Visit: Payer: 59 | Attending: Internal Medicine

## 2019-05-06 DIAGNOSIS — Z23 Encounter for immunization: Secondary | ICD-10-CM

## 2019-05-06 NOTE — Progress Notes (Signed)
   Covid-19 Vaccination Clinic  Name:  Steve Drake    MRN: [id number] DOB: 1965-06-19  05/06/2019  Steve Drake was observed post Covid-19 immunization for 15 minutes without incident. He was provided with Vaccine Information Sheet and instruction to access the V-Safe system.   Steve Drake was instructed to call 911 with any severe reactions post vaccine: Marland Kitchen Difficulty breathing  . Swelling of face and throat  . A fast heartbeat  . A bad rash all over body  . Dizziness and weakness   Immunizations Administered    Name Date Dose VIS Date Route   Pfizer COVID-19 Vaccine 05/06/2019 10:30 AM 0.3 mL 02/27/2018 Intramuscular   Manufacturer: [place]   Lot: [id number]   [place]: [id number]

## 2019-05-06 NOTE — Progress Notes (Signed)
Orthopedic Tech Progress Note Patient Details:  JHAYDEN KUTCHER 09-Dec-1965 [id number]  Ortho Devices Type of Ortho Device: Sling immobilizer Ortho Device/Splint Location: lue Ortho Device/Splint Interventions: Ordered, Application, Adjustment   Post Interventions Patient Tolerated: Well Instructions Provided: Care of device, Adjustment of device   Karolee Stamps 05/06/2019, 3:14 AM

## 2019-05-06 NOTE — ED Notes (Signed)
Discharge instructions reviewed with pt. Pt verbalized understanding.  Spanish interpreter used.

## 2019-05-20 ENCOUNTER — Other Ambulatory Visit: Payer: Self-pay

## 2019-05-20 ENCOUNTER — Ambulatory Visit (AMBULATORY_SURGERY_CENTER): Payer: Self-pay | Admitting: *Deleted

## 2019-05-20 VITALS — Temp 96.9°F | Ht 68.0 in | Wt 185.0 lb

## 2019-05-20 DIAGNOSIS — Z1211 Encounter for screening for malignant neoplasm of colon: Secondary | ICD-10-CM

## 2019-05-20 MED ORDER — NA SULFATE-K SULFATE-MG SULF 17.5-3.13-1.6 GM/177ML PO SOLN: ORAL | 0 refills | Status: DC

## 2019-05-20 NOTE — Progress Notes (Signed)
Patient is here in-person for PV.interpreter also here in [place].  Patient denies any allergies to eggs or soy. Patient denies any problems with anesthesia/sedation. Patient denies any oxygen use at home. Patient denies taking any diet/weight loss medications or blood thinners. Patient is not being treated for MRSA or C-diff. Patient is aware of our care-partner policy and 0000000 safety protocol. EMMI education assisgned to the patient for the procedure, this was explained and instructions given to patient.

## 2019-05-31 ENCOUNTER — Other Ambulatory Visit: Payer: Self-pay

## 2019-05-31 ENCOUNTER — Encounter: Payer: Self-pay | Admitting: Gastroenterology

## 2019-05-31 ENCOUNTER — Ambulatory Visit (AMBULATORY_SURGERY_CENTER): Payer: 59 | Admitting: Gastroenterology

## 2019-05-31 VITALS — BP 136/97 | HR 73 | Temp 98.2°F | Resp 13 | Ht 68.0 in | Wt 185.0 lb

## 2019-05-31 DIAGNOSIS — D122 Benign neoplasm of ascending colon: Secondary | ICD-10-CM | POA: Diagnosis not present

## 2019-05-31 DIAGNOSIS — Z1211 Encounter for screening for malignant neoplasm of colon: Secondary | ICD-10-CM | POA: Diagnosis present

## 2019-05-31 DIAGNOSIS — D127 Benign neoplasm of rectosigmoid junction: Secondary | ICD-10-CM

## 2019-05-31 MED ORDER — SODIUM CHLORIDE 0.9 % IV SOLN
500.0000 mL | Freq: Once | INTRAVENOUS | Status: DC
Start: 2019-05-31 — End: 2019-05-31

## 2019-05-31 NOTE — Progress Notes (Signed)
VS- [employer] used today at the [employer] for this pt.  Interpreter's name is- Karen Chafe covid vaccines on 05-06-19  Cell phone off per pt  Pt's states no medical or surgical changes since previsit or office visit.

## 2019-05-31 NOTE — Progress Notes (Signed)
Reviewed all instructions with interpreter on a stick with pt.  Pt asks questions and all answered, pt verb understanding.

## 2019-05-31 NOTE — Progress Notes (Signed)
Report given to PACU, vss 

## 2019-05-31 NOTE — Op Note (Signed)
[facility] Patient Name: Steve Drake Procedure Date: 05/31/2019 12:25 PM MRN: [id number] Endoscopist: Remo Lipps P. Havery Moros , MD Age: 54 Referring MD:  Date of Birth: 03/19/1965 Gender: Male Account #: [id number] Procedure:                Colonoscopy Indications:              Screening for colorectal malignant neoplasm Medicines:                Monitored Anesthesia Care Procedure:                Pre-Anesthesia Assessment:                           - Prior to the procedure, a History and Physical                            was performed, and patient medications and                            allergies were reviewed. The patient's tolerance of                            previous anesthesia was also reviewed. The risks                            and benefits of the procedure and the sedation                            options and risks were discussed with the patient.                            All questions were answered, and informed consent                            was obtained. Prior Anticoagulants: The patient has                            taken no previous anticoagulant or antiplatelet                            agents. ASA Grade Assessment: II - A patient with                            mild systemic disease. After reviewing the risks                            and benefits, the patient was deemed in                            satisfactory condition to undergo the procedure.                           After obtaining informed consent, the colonoscope  was passed under direct vision. Throughout the                            procedure, the patient's blood pressure, pulse, and                            oxygen saturations were monitored continuously. The                            Colonoscope was introduced through the anus and                            advanced to the the cecum, identified by                            appendiceal orifice  and ileocecal valve. The                            colonoscopy was performed without difficulty. The                            patient tolerated the procedure well. The quality                            of the bowel preparation was good. The ileocecal                            valve, appendiceal orifice, and rectum were                            photographed. Scope In: 12:30:25 PM Scope Out: 12:48:38 PM Scope Withdrawal Time: 0 hours 17 minutes 1 second  Total Procedure Duration: 0 hours 18 minutes 13 seconds  Findings:                 The perianal and digital rectal examinations were                            normal.                           A 4 mm polyp was found in the ascending colon. The                            polyp was sessile. The polyp was removed with a                            cold snare. Resection and retrieval were complete.                           A 5 mm polyp was found in the recto-sigmoid colon.                            The polyp was sessile. The polyp was removed with a  cold snare. Resection and retrieval were complete.                           Internal hemorrhoids were found during                            retroflexion. The hemorrhoids were small.                           Scattered medium-mouthed diverticula were found in                            the entire colon.                           The exam was otherwise without abnormality. Complications:            No immediate complications. Estimated blood loss:                            Minimal. Estimated Blood Loss:     Estimated blood loss was minimal. Impression:               - One 4 mm polyp in the ascending colon, removed                            with a cold snare. Resected and retrieved.                           - One 5 mm polyp at the recto-sigmoid colon,                            removed with a cold snare. Resected and retrieved.                           -  Internal hemorrhoids.                           - Diverticulosis in the entire examined colon.                           - The examination was otherwise normal. Recommendation:           - Patient has a contact number available for                            emergencies. The signs and symptoms of potential                            delayed complications were discussed with the                            patient. Return to normal activities tomorrow.                            Written discharge instructions were provided to the  patient.                           - Resume previous diet.                           - Continue present medications.                           - Await pathology results. Remo Lipps P. Havery Moros, MD 05/31/2019 12:52:28 PM This report has been signed electronically.

## 2019-05-31 NOTE — Patient Instructions (Signed)
Handouts given for polyps, hemorrhoids and diverticulosis.  USTED TUVO UN PROCEDIMIENTO ENDOSCPICO HOY EN EL [facility]:   Lea el informe del procedimiento que se le entreg para cualquier pregunta especfica sobre lo que se Primary school teacher.  Si el informe del examen no responde a sus preguntas, por favor llame a su gastroenterlogo para aclararlo.  Si usted solicit que no se le den [employer] de lo que se Estate manager/land agent en su procedimiento al [employer] va a cuidar, entonces el informe del procedimiento se ha incluido en un sobre sellado para que usted lo revise despus cuando le sea ms conveniente.   LO QUE PUEDE ESPERAR: Algunas sensaciones de hinchazn en el abdomen.  Puede tener ms gases de lo normal.  El caminar puede ayudarle a eliminar el aire que se le puso en el tracto gastrointestinal durante el procedimiento y reducir la hinchazn.  Si le hicieron una endoscopia inferior (como una colonoscopia o una sigmoidoscopia flexible), podra notar manchas de sangre en las heces fecales o en el papel higinico.  Si se someti a una preparacin intestinal para su procedimiento, es posible que no tenga una evacuacin intestinal normal durante [employer].   Tenga en cuenta:  Es posible que note un poco de irritacin y congestin en la nariz o algn drenaje.  Esto es debido al oxgeno [employer] durante su procedimiento.  No hay que preocuparse y esto debe desaparecer ms o Scientist, research (medical).   SNTOMAS PARA REPORTAR INMEDIATAMENTE:  Despus de una endoscopia inferior (colonoscopia o sigmoidoscopia flexible):  Cantidades excesivas de sangre en las heces fecales  Sensibilidad significativa o empeoramiento de los dolores abdominales   Hinchazn aguda del abdomen que antes no tena   Fiebre de 100F o ms     Para asuntos urgentes o de Freight forwarder, puede comunicarse con un gastroenterlogo a cualquier hora llamando al [phone].  DIETA:  Recomendamos una comida pequea al  principio, pero luego puede continuar con su dieta normal.  Tome muchos lquidos, Teacher, adult education las bebidas alcohlicas durante 24 horas.    ACTIVIDAD:  Debe planear tomarse las cosas con calma por el resto del da y no debe CONDUCIR ni usar maquinaria pesada Programmer, applications (debido a los medicamentos de sedacin utilizados durante el examen).     SEGUIMIENTO: Nuestro personal llamar al nmero que aparece en su historial al siguiente da hbil de su procedimiento para ver cmo se siente y para responder cualquier pregunta o inquietud que pueda tener con respecto a la informacin que se le dio despus del procedimiento. Si no podemos contactarle, le dejaremos un mensaje.  Sin embargo, si se siente bien y no tiene Paediatric nurse, no es necesario que nos devuelva la llamada.  Asumiremos que ha regresado a sus actividades diarias normales sin incidentes. Si se le tomaron algunas biopsias, le contactaremos por telfono o por carta en las prximas 3 semanas.  Si no ha sabido [employer] biopsias en el transcurso de 3 semanas, por favor llmenos al [phone].   FIRMAS/CONFIDENCIALIDAD: Usted y/o el acompaante que le cuide han firmado documentos que se ingresarn en su historial mdico electrnico.  Estas firmas atestiguan el hecho de que la informacin anterior

## 2019-05-31 NOTE — Progress Notes (Signed)
Called to room to assist during endoscopic procedure.  Patient ID and intended procedure confirmed with present staff. Received instructions for my participation in the procedure from the performing physician.  

## 2019-06-04 ENCOUNTER — Telehealth: Payer: Self-pay

## 2019-06-04 NOTE — Telephone Encounter (Signed)
  Follow up Call-  Call back number 05/31/2019  Post procedure Call Back phone  # [phone]  Permission to leave phone message No  Some recent data might be hidden     Patient questions:  Do you have a fever, pain , or abdominal swelling? No. Pain Score  0 *  Have you tolerated food without any problems? Yes.    Have you been able to return to your normal activities? Yes.    Do you have any questions about your discharge instructions: Diet   No. Medications  No. Follow up visit  No.  Do you have questions or concerns about your Care? No.  Actions: * If pain score is 4 or above: No action needed, pain <4.  1. Have you developed a fever since your procedure? no  2.   Have you had an respiratory symptoms (SOB or cough) since your procedure? no  3.   Have you tested positive for COVID 19 since your procedure no  4.   Have you had any family members/close contacts diagnosed with the COVID 19 since your procedure?  no   If yes to any of these questions please route to Joylene John, RN and Erenest Rasher, RN

## 2019-06-04 NOTE — Telephone Encounter (Signed)
First attempt follow up call to pt, lm on vm 

## 2019-06-05 ENCOUNTER — Encounter: Payer: Self-pay | Admitting: Gastroenterology

## 2019-06-21 ENCOUNTER — Telehealth: Payer: Self-pay | Admitting: Family Medicine

## 2019-06-21 MED ORDER — METFORMIN HCL 500 MG PO TABS: 500.0000 mg | ORAL_TABLET | Freq: Two times a day (BID) | ORAL | 0 refills | Status: DC

## 2019-06-21 NOTE — Telephone Encounter (Signed)
1) Medication(s) Requested (by name):metFORMIN (GLUCOPHAGE) 500 MG tablet [[id number]]    2) [facility] [place] - [address]  [address], [place] ([place]) [place]   3) Special Requests:   Approved medications will be sent to the pharmacy, we will reach out if there is an issue.  Requests made after 3pm may not be addressed until the following business day!  If a patient is unsure of the name of the medication(s) please note and ask patient to call back when they are able to provide all info, do not send to responsible party until all information is available!

## 2019-07-11 ENCOUNTER — Other Ambulatory Visit: Payer: Self-pay

## 2019-07-11 ENCOUNTER — Encounter: Payer: Self-pay | Admitting: Internal Medicine

## 2019-07-11 ENCOUNTER — Ambulatory Visit (INDEPENDENT_AMBULATORY_CARE_PROVIDER_SITE_OTHER): Payer: 59 | Admitting: Internal Medicine

## 2019-07-11 VITALS — BP 143/96 | HR 67 | Temp 97.7°F | Resp 17 | Wt 185.0 lb

## 2019-07-11 DIAGNOSIS — R7303 Prediabetes: Secondary | ICD-10-CM

## 2019-07-11 DIAGNOSIS — I10 Essential (primary) hypertension: Secondary | ICD-10-CM | POA: Diagnosis not present

## 2019-07-11 LAB — POCT GLYCOSYLATED HEMOGLOBIN (HGB A1C): Hemoglobin A1C: 6.1 % — AB (ref 4.0–5.6)

## 2019-07-11 MED ORDER — LISINOPRIL 20 MG PO TABS: 20.0000 mg | ORAL_TABLET | Freq: Every day | ORAL | 3 refills | Status: DC

## 2019-07-11 NOTE — Progress Notes (Signed)
°  Subjective:    Steve Drake - 54 y.o. male MRN [id number]  Date of birth: 11-24-1965  HPI  Steve Drake is here for follow up of chronic medical conditions.  Chronic HTN Disease Monitoring:  Home BP Monitoring - Reports BP measurements at home were 140s/90s.  Chest pain- no  Dyspnea- no Headache - no  Medications: Amlodipine 5 mg  Compliance- no, says he stopped medication due to it not doing anything for him Lightheadedness- no  Edema- no    PreDiabetes: Takes Metformin 500 mg BID. Last A1c 6.3% in Jan 2021.    Health Maintenance:  There are no preventive care reminders to display for this patient.  -  reports that he has quit smoking. He has never used smokeless tobacco. - Review of Systems: Per HPI. - Past Medical History: Patient Active Problem List   Diagnosis Date Noted   Prediabetes 01/11/2019   Mixed hyperlipidemia 01/11/2019   - Medications: reviewed and updated   Objective:   Physical Exam BP (!) 143/96    Pulse 67    Temp 97.7 F (36.5 C) (Temporal)    Resp 17    Wt 185 lb (83.9 kg)    SpO2 98%    BMI 28.13 kg/m  Physical Exam Constitutional:      General: He is not in acute distress.    Appearance: He is not diaphoretic.  HENT:     Head: Normocephalic and atraumatic.  Eyes:     Conjunctiva/sclera: Conjunctivae normal.  Cardiovascular:     Rate and Rhythm: Normal rate and regular rhythm.     Heart sounds: Normal heart sounds. No murmur heard.   Pulmonary:     Effort: Pulmonary effort is normal. No respiratory distress.     Breath sounds: Normal breath sounds.  Musculoskeletal:        General: Normal range of motion.  Skin:    General: Skin is warm and dry.  Neurological:     Mental Status: He is alert and oriented to person, place, and time.  Psychiatric:        Mood and Affect: Affect normal.        Judgment: Judgment normal.      Assessment & Plan:    1. Prediabetes A1c improved to 6.1%. Discussed normal  A1c. Continue Metformin. Carb modified diet and 150 minutes of cardiac exercise per week recommended.  - HgB A1c  2. Essential hypertension BP above goal. Patient self discontinued Amlodipine and would like to try different medication. Will start Lisinopril 20 mg. Return in 2 weeks for lab monitoring and BP check.  Counseled on blood pressure goal of less than 130/80, low-sodium, DASH diet, medication compliance, 150 minutes of moderate intensity exercise per week. Discussed medication compliance, adverse effects. - Basic metabolic panel; Future - lisinopril (ZESTRIL) 20 MG tablet; Take 1 tablet (20 mg total) by mouth daily.  Dispense: 90 tablet; Refill: [place], D.O. 07/11/2019, 9:49 AM Primary Care at [facility] [place]

## 2019-07-25 ENCOUNTER — Ambulatory Visit (INDEPENDENT_AMBULATORY_CARE_PROVIDER_SITE_OTHER): Payer: 59

## 2019-07-25 ENCOUNTER — Other Ambulatory Visit: Payer: Self-pay

## 2019-07-25 DIAGNOSIS — I10 Essential (primary) hypertension: Secondary | ICD-10-CM

## 2019-07-25 NOTE — Progress Notes (Signed)
Patient here for BP check & repeat BMP. After sitting BP was 140/93, pulse 70. Spoke with provider who states to continue current medications & follow up in 3 months. Laurance Flatten, CMA

## 2019-07-26 LAB — BASIC METABOLIC PANEL
BUN/Creatinine Ratio: 15 (ref 9–20)
BUN: 19 mg/dL (ref 6–24)
CO2: 25 mmol/L (ref 20–29)
Calcium: 9.1 mg/dL (ref 8.7–10.2)
Chloride: 104 mmol/L (ref 96–106)
Creatinine, Ser: 1.24 mg/dL (ref 0.76–1.27)
GFR calc Af Amer: 75 mL/min/{1.73_m2} (ref >59)
GFR calc non Af Amer: 65 mL/min/{1.73_m2} (ref >59)
Glucose: 113 mg/dL — ABNORMAL HIGH (ref 65–99)
Potassium: 4.2 mmol/L (ref 3.5–5.2)
Sodium: 141 mmol/L (ref 134–144)

## 2019-07-26 NOTE — Progress Notes (Signed)
Normal lab letter mailed.

## 2019-10-25 ENCOUNTER — Telehealth: Payer: 59 | Admitting: Internal Medicine

## 2019-11-12 ENCOUNTER — Telehealth: Payer: Self-pay | Admitting: Internal Medicine

## 2019-11-12 MED ORDER — METFORMIN HCL 500 MG PO TABS: 500.0000 mg | ORAL_TABLET | Freq: Two times a day (BID) | ORAL | 0 refills | Status: DC

## 2019-11-12 NOTE — Telephone Encounter (Signed)
1) Medication(s) Requested (by name): lisinopril (ZESTRIL) 20 MG tablet  metFORMIN (GLUCOPHAGE) 500 MG tablet   2) Pharmacy of Choice: [facility] ([facility]), [place] - [address]   3) Special Requests: Patient states he had not had medication in over 3 days. Please f/u    Approved medications will be sent to the pharmacy, we will reach out if there is an issue.  Requests made after 3pm may not be addressed until the following business day!  If a patient is unsure of the name of the medication(s) please note and ask patient to call back when they are able to provide all info, do not send to responsible party until all information is available!

## 2020-02-24 ENCOUNTER — Telehealth: Payer: Self-pay | Admitting: Internal Medicine

## 2020-02-24 NOTE — Telephone Encounter (Signed)
1) Medication(s) Requested (by name): metFORMIN (GLUCOPHAGE) 500 MG tablet   lisinopril lisinopril (ZESTRIL) 20 MG tablet   2) Pharmacy of Choice: [facility] [address]  3) Special Requests:   Approved medications will be sent to the pharmacy, we will reach out if there is an issue.  Requests made after 3pm may not be addressed until the following business day!  If a patient is unsure of the name of the medication(s) please note and ask patient to call back when they are able to provide all info, do not send to responsible party until all information is available!

## 2020-02-26 ENCOUNTER — Other Ambulatory Visit: Payer: Self-pay

## 2020-02-26 DIAGNOSIS — R7303 Prediabetes: Secondary | ICD-10-CM

## 2020-02-26 MED ORDER — METFORMIN HCL 500 MG PO TABS: 500.0000 mg | ORAL_TABLET | Freq: Two times a day (BID) | ORAL | 0 refills | Status: DC

## 2020-02-26 NOTE — Telephone Encounter (Signed)
There was a note on last refill that pt needs appt prior to any further refills, I have sent a 30-day supply but please contact pt and let him know he needs an appt for follow-up/updated labs prior to any further refills after this. Thanks

## 2020-06-24 ENCOUNTER — Telehealth: Payer: Self-pay | Admitting: Internal Medicine

## 2020-06-24 NOTE — Telephone Encounter (Signed)
1) Medication(s) Requested (by name):  metFORMIN (GLUCOPHAGE) 500 MG tablet lisinopril (ZESTRIL) 20 MG tablet    2) Pharmacy of Choice:  [facility] [address] ([address] [place]  [address] [place] ([place]) [place] [address]  Phone:  [phone]  Fax:  [phone]

## 2020-06-25 ENCOUNTER — Other Ambulatory Visit: Payer: Self-pay

## 2020-06-25 DIAGNOSIS — I10 Essential (primary) hypertension: Secondary | ICD-10-CM

## 2020-06-25 DIAGNOSIS — R7303 Prediabetes: Secondary | ICD-10-CM

## 2020-06-25 MED ORDER — METFORMIN HCL 500 MG PO TABS: 500.0000 mg | ORAL_TABLET | Freq: Two times a day (BID) | ORAL | 0 refills | Status: DC

## 2020-06-25 MED ORDER — LISINOPRIL 20 MG PO TABS: 20.0000 mg | ORAL_TABLET | Freq: Every day | ORAL | 0 refills | Status: DC

## 2020-06-25 NOTE — Progress Notes (Signed)
Lisinopril and Metformin refilled for 30 day courtesy next appt 7/20/222

## 2020-07-21 ENCOUNTER — Other Ambulatory Visit: Payer: Self-pay | Admitting: Family

## 2020-07-21 DIAGNOSIS — R7303 Prediabetes: Secondary | ICD-10-CM

## 2020-07-21 DIAGNOSIS — I10 Essential (primary) hypertension: Secondary | ICD-10-CM

## 2020-07-22 ENCOUNTER — Ambulatory Visit (INDEPENDENT_AMBULATORY_CARE_PROVIDER_SITE_OTHER): Payer: 59 | Admitting: Family

## 2020-07-22 ENCOUNTER — Encounter: Payer: Self-pay | Admitting: Family

## 2020-07-22 ENCOUNTER — Other Ambulatory Visit: Payer: Self-pay

## 2020-07-22 VITALS — BP 156/98 | HR 64 | Temp 98.1°F | Resp 15 | Ht 67.64 in | Wt 190.0 lb

## 2020-07-22 DIAGNOSIS — I10 Essential (primary) hypertension: Secondary | ICD-10-CM | POA: Diagnosis not present

## 2020-07-22 DIAGNOSIS — E119 Type 2 diabetes mellitus without complications: Secondary | ICD-10-CM | POA: Insufficient documentation

## 2020-07-22 LAB — POCT GLYCOSYLATED HEMOGLOBIN (HGB A1C): Hemoglobin A1C: 6.7 % — AB (ref 4.0–5.6)

## 2020-07-22 MED ORDER — METFORMIN HCL 1000 MG PO TABS: 1000.0000 mg | ORAL_TABLET | Freq: Every day | ORAL | 0 refills | Status: DC

## 2020-07-22 NOTE — Progress Notes (Signed)
Pt presents for prediabetes f/u A1c 6.1 07/2019.

## 2020-07-22 NOTE — Progress Notes (Signed)
Patient ID: Steve Drake, male    DOB: [birth year]  MRN: [id number]  CC: Pre-Diabetes and Hypertension Follow-Up  Subjective: Steve Drake is a 55 y.o. male who presents for prediabetes and hypertension follow-up.  His concerns today include:   PREDIABETES FOLLOW-UP: 07/11/2019 per DO note: A1c improved to 6.1%. Discussed normal A1c. Continue Metformin. Carb modified diet and 150 minutes of cardiac exercise per week recommended.   07/22/2020: Reports taking Metformin 500 mg once daily instead of prescribed Metformin 500 mg twice daily. Unable to explain why he is taking only once daily.  Medication side effects:  []  Yes    [x]  No Home Monitoring?  [x]  Yes    []  No Diet Adherence: []  Yes    [x]  No, endorses eating plenty of rice Exercise: []  Yes    [x]  No  2. HYPERTENSION FOLLOW-UP: 07/11/2019 per DO note: BP above goal. Patient self discontinued Amlodipine and would like to try different medication. Will start Lisinopril 20 mg. Return in 2 weeks for lab monitoring and BP check.   07/22/2020: Currently taking: see medication list Have you taken your blood pressure medication today: []  Yes [x]  No, reports had 3 beers last night and slept too late, forgot to take Med Adherence: [x]  Yes    []  No Medication side effects: []  Yes    [x]  No Adherence with salt restriction (low-salt diet): [x]  Yes  []  No Exercise: Yes []  No [x]  Home Monitoring?: [x]  Yes    []  No Monitoring Frequency: [x]  Yes    []  No Home BP results range: [x]  Yes, 110's/70's   Patient Active Problem List   Diagnosis Date Noted   Type 2 diabetes mellitus ([place]) 07/22/2020   Prediabetes 01/11/2019   Mixed hyperlipidemia 01/11/2019     Current Outpatient Medications on File Prior to Visit  Medication Sig Dispense Refill   aspirin EC 81 MG tablet Take 81 mg by mouth daily.     Omega-3 Fatty Acids (FISH OIL) 1000 MG CPDR Take 1,000 mg by mouth 2 (two) times daily. 60 capsule 2   No current facility-administered  medications on file prior to visit.    Allergies  Allergen Reactions   Almond (Diagnostic)     Throat swelling   Apple     Throat swelling    Social History   Socioeconomic History   Marital status: Married    Spouse name: Not on file   Number of children: Not on file   Years of education: Not on file   Highest education level: Not on file  Occupational History   Not on file  Tobacco Use   Smoking status: Former   Smokeless tobacco: Never   Tobacco comments:    quit 10 years ago per pt  Vaping Use   Vaping Use: Never used  Substance and Sexual Activity   Alcohol use: Yes    Alcohol/week: 3.0 standard drinks    Types: 3 Cans of beer per week    Comment: sometimes   Drug use: Never   Sexual activity: Yes    Partners: Female  Other Topics Concern   Not on file  Social History Narrative   Not on file   Social Determinants of Health   Financial Resource Strain: Not on file  Food Insecurity: Not on file  Transportation Needs: Not on file  Physical Activity: Not on file  Stress: Not on file  Social Connections: Not on file  Intimate Partner Violence: Not on file  Family History  Problem Relation Age of Onset   Diabetes Mother    Early death Father    Hypertension Brother    Colon cancer Neg Hx    Esophageal cancer Neg Hx    Rectal cancer Neg Hx    Stomach cancer Neg Hx    Colon polyps Neg Hx     Past Surgical History:  Procedure Laterality Date   COLONOSCOPY  over 10 years ago    HERNIA REPAIR     over 10 years ago     ROS: Review of Systems Negative except as stated above  PHYSICAL EXAM: BP (!) 156/98 (BP Location: Left Arm, Patient Position: Sitting, Cuff Size: Large)   Pulse 64   Temp 98.1 F (36.7 C)   Resp 15   Ht 5' 7.64" (1.718 m)   Wt 190 lb (86.2 kg)   SpO2 99%   BMI 29.20 kg/m   Physical Exam HENT:     Head: Normocephalic and atraumatic.  Eyes:     Extraocular Movements: Extraocular movements intact.      Conjunctiva/sclera: Conjunctivae normal.     Pupils: Pupils are equal, round, and reactive to light.  Cardiovascular:     Rate and Rhythm: Normal rate and regular rhythm.     Pulses: Normal pulses.     Heart sounds: Normal heart sounds.  Pulmonary:     Effort: Pulmonary effort is normal.     Breath sounds: Normal breath sounds.  Musculoskeletal:     Cervical back: Normal range of motion and neck supple.  Neurological:     General: No focal deficit present.     Mental Status: He is alert and oriented to person, place, and time.  Psychiatric:        Mood and Affect: Mood normal.        Behavior: Behavior normal.   ASSESSMENT AND PLAN: 1. Type 2 diabetes mellitus without complication, without long-term current use of insulin ([place]): - Hemoglobin A1c at goal today at 6.7%, goal < 7%. This is increased from previous hemoglobin A1c of 6.1% on 07/11/2019. Next hemoglobin A1c due October 2022.  - Increase Metformin from 500 mg once daily to 1000 mg once daily.  - Discussed the importance of healthy eating habits, low-carbohydrate diet, low-sugar diet, regular aerobic exercise (at least 150 minutes a week as tolerated) and medication compliance to achieve or maintain control of diabetes. - CMP to check kidney function, liver function, and electrolyte balance.  - Follow-up with primary provider in 4 weeks or sooner if needed. - POCT glycosylated hemoglobin (Hb A1C) - metFORMIN (GLUCOPHAGE) 1000 MG tablet; Take 1 tablet (1,000 mg total) by mouth daily with breakfast.  Dispense: 60 tablet; Refill: 0 - Comprehensive metabolic panel  2. Essential hypertension: - Blood pressure not at goal during today's visit. Patient asymptomatic without chest pressure, chest pain, palpitations, shortness of breath, and worst headache of life. - Patient endorses he did not take blood pressure medication today as of present. Also, reports home blood pressures are normal at 110's/70's. - Counseled to take blood  pressure medication once he returns home, patient agreeable. - Last visit for hypertension was about 1 year ago (07/11/2019) and initiated on Lisinopril, will have him continue the same.  - Prior to Lisinopril patient taking Amlodipine and self discontinued because he felt it was ineffective. - Counseled to follow-up with primary provider in 4 weeks or sooner if needed.    Patient was given the opportunity to ask questions.  Patient verbalized understanding of the plan and was able to repeat key elements of the plan. Patient was given clear instructions to go to Emergency Department or return to medical center if symptoms don't improve, worsen, or new problems develop.The patient verbalized understanding.   Orders Placed This Encounter  Procedures   Comprehensive metabolic panel   POCT glycosylated hemoglobin (Hb A1C)    Requested Prescriptions   Signed Prescriptions Disp Refills   metFORMIN (GLUCOPHAGE) 1000 MG tablet 60 tablet 0    Sig: Take 1 tablet (1,000 mg total) by mouth daily with breakfast.    Return for Follow-Up 4 weeks diabets and 3 months hypertension .  Camillia Herter, NP

## 2020-07-23 ENCOUNTER — Other Ambulatory Visit: Payer: Self-pay | Admitting: Family

## 2020-07-23 ENCOUNTER — Telehealth: Payer: Self-pay | Admitting: Internal Medicine

## 2020-07-23 ENCOUNTER — Other Ambulatory Visit: Payer: Self-pay

## 2020-07-23 DIAGNOSIS — I10 Essential (primary) hypertension: Secondary | ICD-10-CM

## 2020-07-23 LAB — COMPREHENSIVE METABOLIC PANEL
ALT: 22 IU/L (ref 0–44)
AST: 21 IU/L (ref 0–40)
Albumin/Globulin Ratio: 1.8 (ref 1.2–2.2)
Albumin: 4.8 g/dL (ref 3.8–4.9)
Alkaline Phosphatase: 77 IU/L (ref 44–121)
BUN/Creatinine Ratio: 15 (ref 9–20)
BUN: 17 mg/dL (ref 6–24)
Bilirubin Total: 0.7 mg/dL (ref 0.0–1.2)
CO2: 22 mmol/L (ref 20–29)
Calcium: 9.2 mg/dL (ref 8.7–10.2)
Chloride: 98 mmol/L (ref 96–106)
Creatinine, Ser: 1.16 mg/dL (ref 0.76–1.27)
Globulin, Total: 2.6 g/dL (ref 1.5–4.5)
Glucose: 104 mg/dL — ABNORMAL HIGH (ref 65–99)
Potassium: 4.6 mmol/L (ref 3.5–5.2)
Sodium: 135 mmol/L (ref 134–144)
Total Protein: 7.4 g/dL (ref 6.0–8.5)
eGFR: 73 mL/min/{1.73_m2} (ref >59)

## 2020-07-23 MED ORDER — LISINOPRIL 20 MG PO TABS
20.0000 mg | ORAL_TABLET | Freq: Every day | ORAL | 0 refills | Status: DC
Start: 2020-07-23 — End: 2020-08-21

## 2020-07-23 NOTE — Telephone Encounter (Signed)
Pt was just seen but states his Lisinopril wasn't sent to the Pharmacy, only the Metformin. He only has 2 left & seeking urgent refill to have enough for the weekend.    lisinopril (ZESTRIL) 20 MG tablet [[id number]]   Pharmacy  [facility] [place] - [place]  [address] [place]) [place] [address]  Phone:  [phone]  Fax:  [phone]  DEA #:  --  Thank you.

## 2020-07-23 NOTE — Progress Notes (Signed)
Please call patient with update.   Kidney function normal.   Liver function normal.  Diabetes discussed in office.

## 2020-08-20 ENCOUNTER — Other Ambulatory Visit: Payer: Self-pay | Admitting: Family

## 2020-08-20 DIAGNOSIS — I10 Essential (primary) hypertension: Secondary | ICD-10-CM

## 2020-08-20 DIAGNOSIS — R7303 Prediabetes: Secondary | ICD-10-CM

## 2020-08-26 NOTE — Progress Notes (Signed)
Patient did not show for appointment.   

## 2020-08-27 ENCOUNTER — Encounter: Payer: 59 | Admitting: Family

## 2020-08-27 DIAGNOSIS — I10 Essential (primary) hypertension: Secondary | ICD-10-CM

## 2020-08-27 DIAGNOSIS — Z789 Other specified health status: Secondary | ICD-10-CM

## 2020-08-27 DIAGNOSIS — E119 Type 2 diabetes mellitus without complications: Secondary | ICD-10-CM

## 2020-09-03 IMAGING — DX DG KNEE COMPLETE 4+V*L*
4 series · 4 of 4 positions shown · non-contrast
Comparison: None.

CLINICAL DATA: Left knee pain.  No recent injury.

EXAM:
LEFT KNEE - COMPLETE 4+ VIEW

[knee ap (1 of 3)]
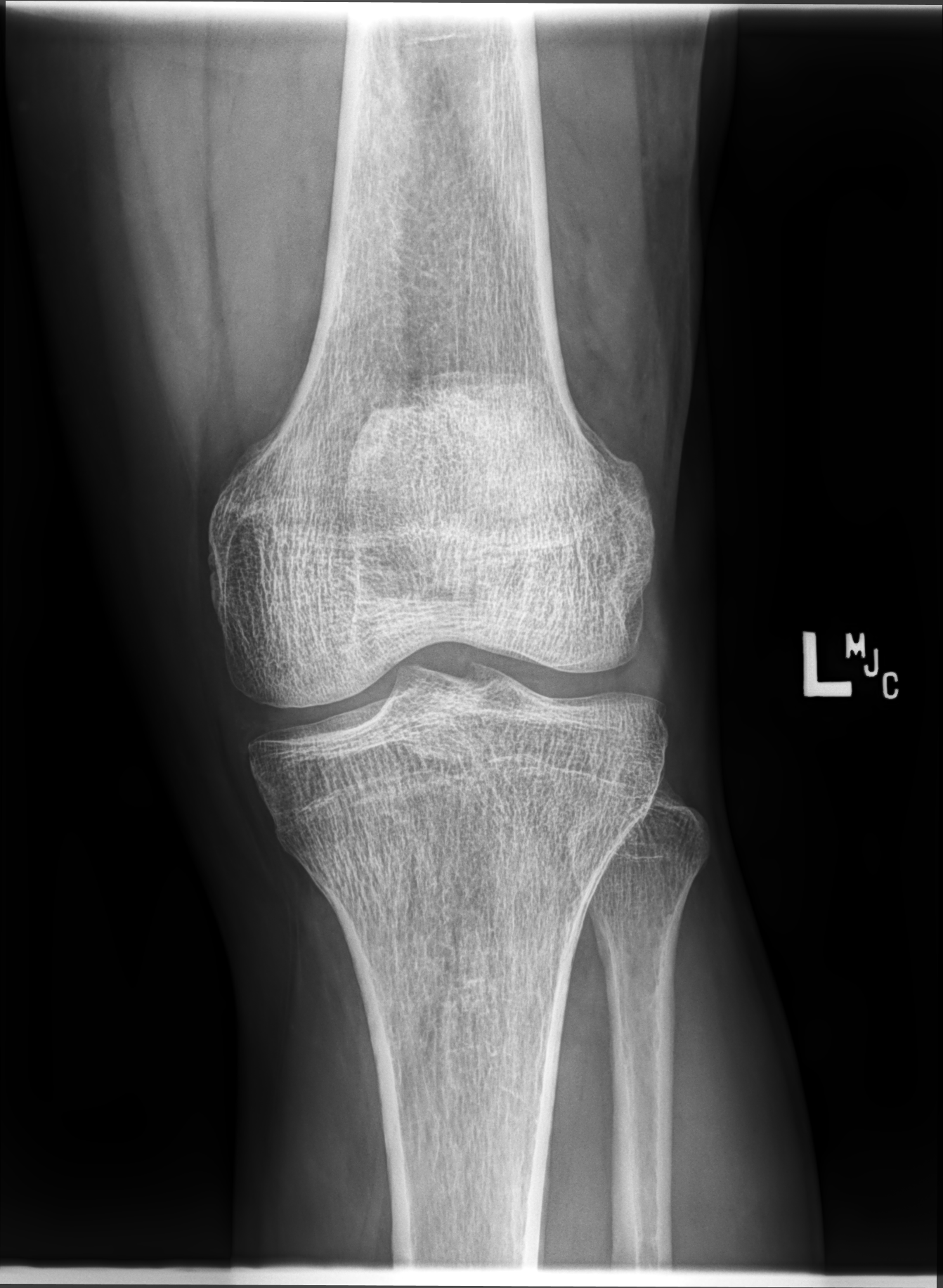

[knee ap (2 of 3)]
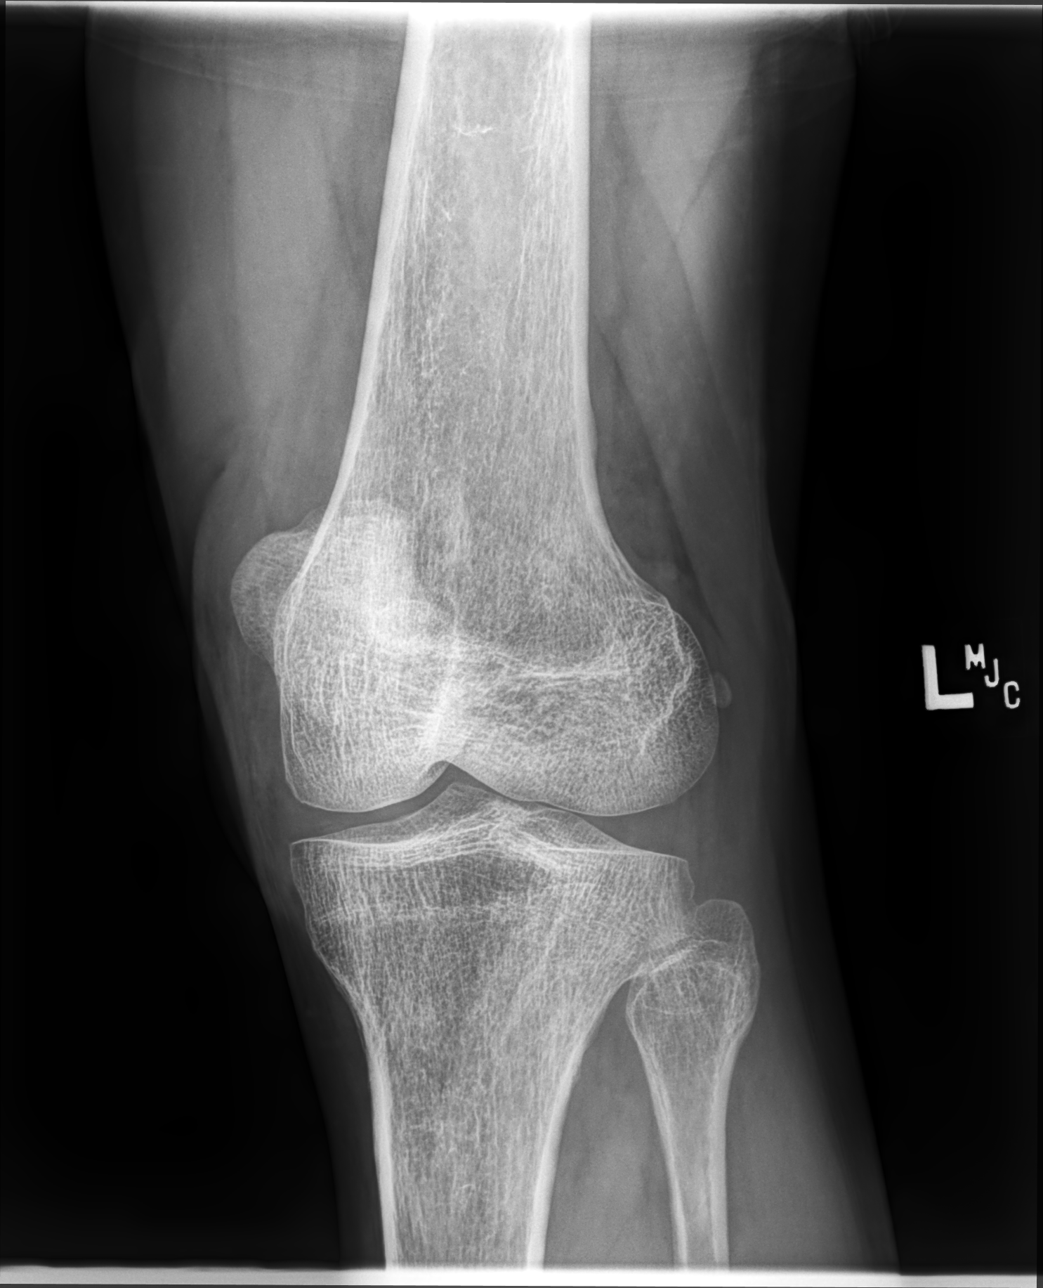

[knee ap (3 of 3)]
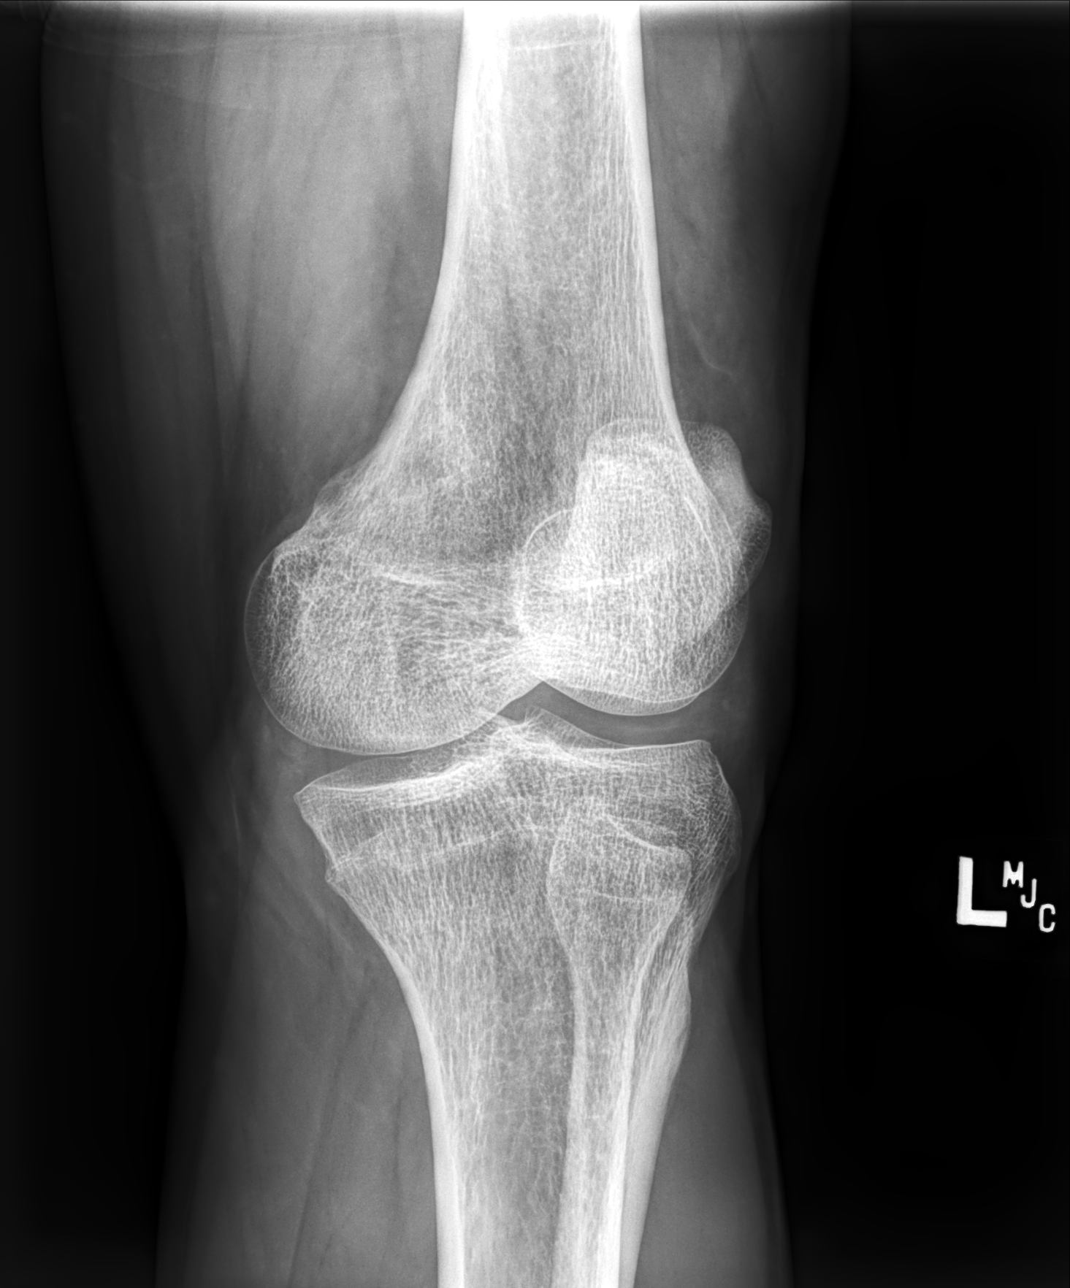

[knee lat]
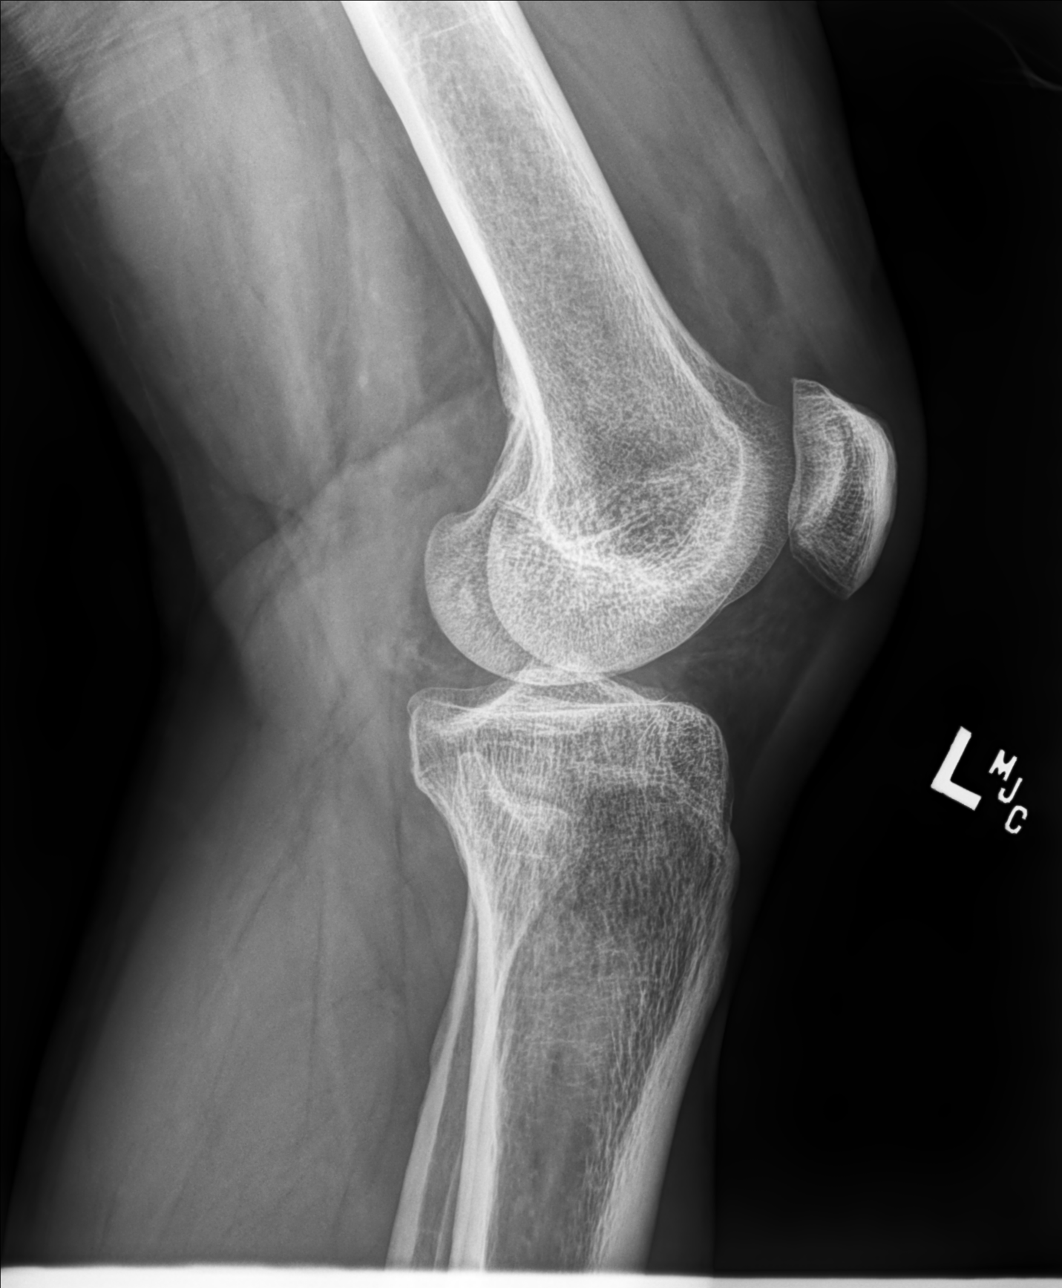

[4 of 4 positions shown; findings below may reference images not displayed]

FINDINGS: Trace joint effusion. Joint spaces are maintained. No acute bony
abnormality. Specifically, no fracture, subluxation, or dislocation.
IMPRESSION: Small joint effusion.  No acute bony abnormality.

## 2020-09-19 ENCOUNTER — Other Ambulatory Visit: Payer: Self-pay | Admitting: Family Medicine

## 2020-09-19 DIAGNOSIS — I10 Essential (primary) hypertension: Secondary | ICD-10-CM

## 2020-09-19 DIAGNOSIS — R7303 Prediabetes: Secondary | ICD-10-CM

## 2020-10-19 NOTE — Progress Notes (Signed)
Erroneous encounter

## 2020-10-22 ENCOUNTER — Encounter: Payer: 59 | Admitting: Family

## 2020-10-22 DIAGNOSIS — I10 Essential (primary) hypertension: Secondary | ICD-10-CM

## 2020-10-22 DIAGNOSIS — Z789 Other specified health status: Secondary | ICD-10-CM

## 2020-10-22 DIAGNOSIS — E119 Type 2 diabetes mellitus without complications: Secondary | ICD-10-CM

## 2020-12-27 IMAGING — DX DG CHEST 2V
2 series · 2 of 2 positions shown · non-contrast
Comparison: None.

CLINICAL DATA: Shortness of breath.

EXAM:
CHEST - 2 VIEW

[w chest pa]
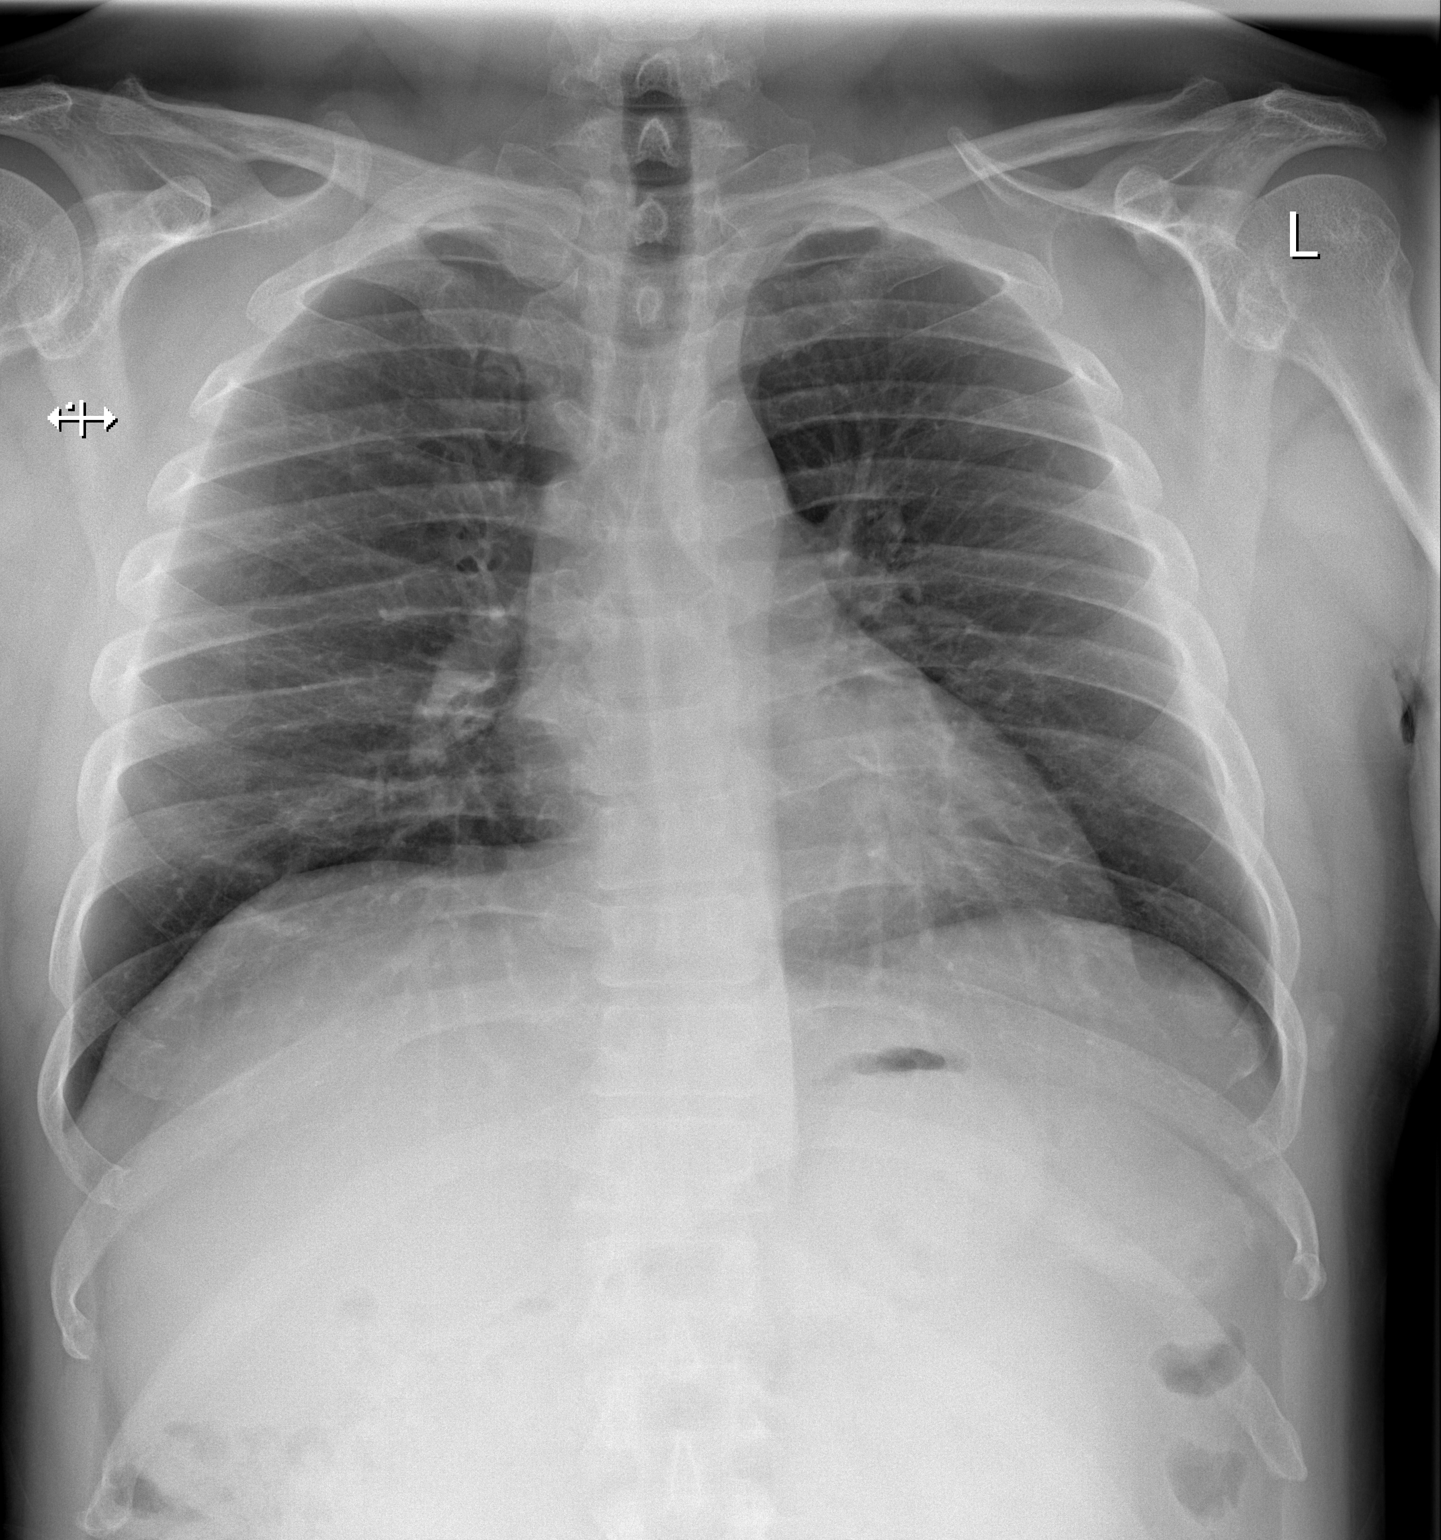

[w chest lat]
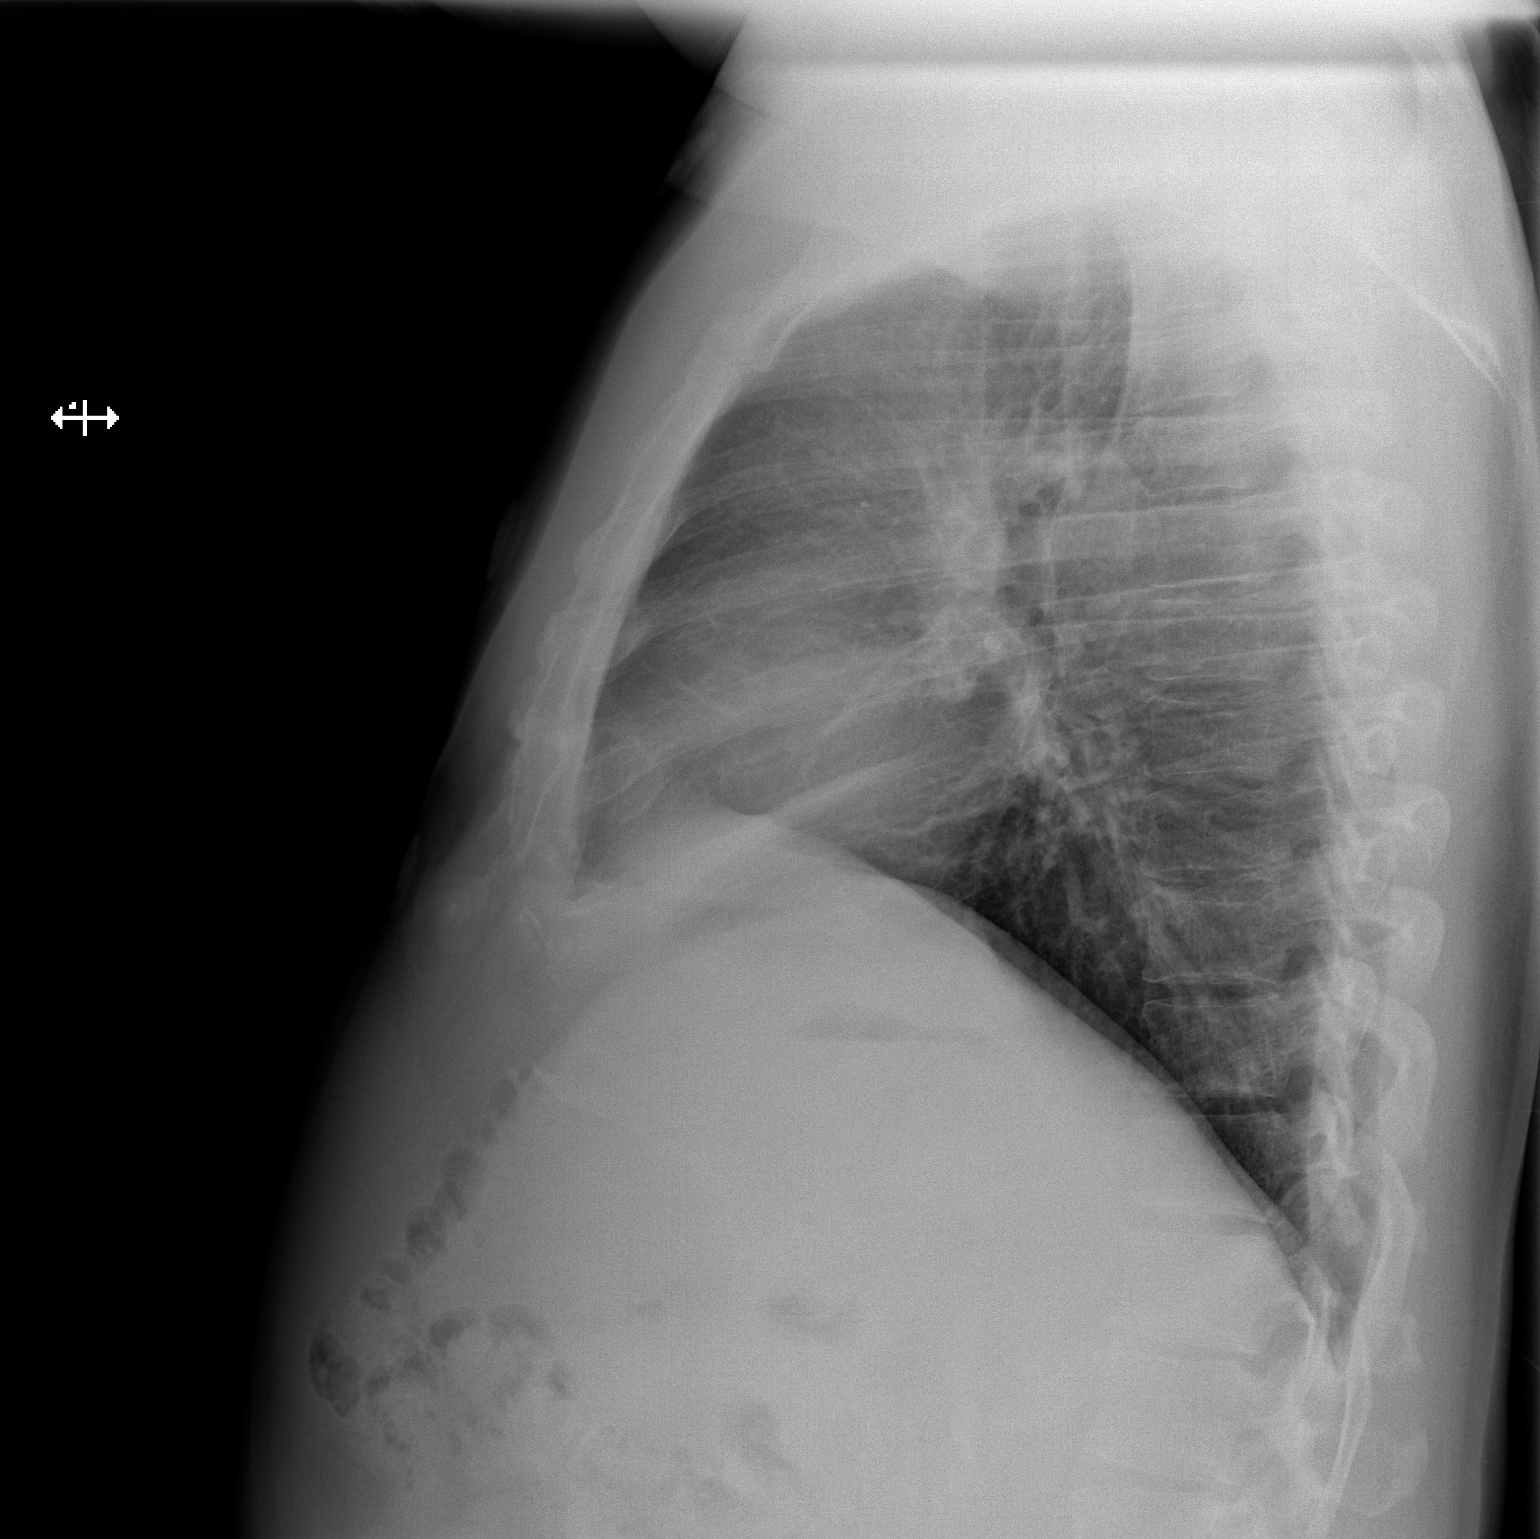

[2 of 2 positions shown; findings below may reference images not displayed]

FINDINGS: The heart size and mediastinal contours are within normal limits.
The lungs are clear. No pneumothorax or pleural effusion. No acute
finding in the visualized skeleton.
IMPRESSION: No acute cardiopulmonary process.

## 2021-02-11 ENCOUNTER — Other Ambulatory Visit: Payer: Self-pay

## 2021-02-11 DIAGNOSIS — R7303 Prediabetes: Secondary | ICD-10-CM

## 2021-02-11 MED ORDER — METFORMIN HCL 500 MG PO TABS: ORAL_TABLET | ORAL | 0 refills | Status: DC

## 2021-02-16 ENCOUNTER — Encounter: Payer: Self-pay | Admitting: Family Medicine

## 2021-02-16 ENCOUNTER — Other Ambulatory Visit: Payer: Self-pay

## 2021-02-16 ENCOUNTER — Ambulatory Visit (INDEPENDENT_AMBULATORY_CARE_PROVIDER_SITE_OTHER): Payer: 59 | Admitting: Family Medicine

## 2021-02-16 VITALS — BP 151/97 | HR 70 | Temp 98.1°F | Resp 16 | Wt 185.6 lb

## 2021-02-16 DIAGNOSIS — E1169 Type 2 diabetes mellitus with other specified complication: Secondary | ICD-10-CM

## 2021-02-16 DIAGNOSIS — I10 Essential (primary) hypertension: Secondary | ICD-10-CM

## 2021-02-16 LAB — POCT GLYCOSYLATED HEMOGLOBIN (HGB A1C): Hemoglobin A1C: 6.7 % — AB (ref 4.0–5.6)

## 2021-02-16 MED ORDER — JANUMET 50-500 MG PO TABS: 1.0000 | ORAL_TABLET | Freq: Two times a day (BID) | ORAL | 0 refills | Status: DC

## 2021-02-16 MED ORDER — LOSARTAN POTASSIUM 50 MG PO TABS
50.0000 mg | ORAL_TABLET | Freq: Every day | ORAL | 0 refills | Status: AC
Start: 2021-02-16 — End: ?

## 2021-02-16 MED ORDER — LOSARTAN POTASSIUM 50 MG PO TABS: 50.0000 mg | ORAL_TABLET | Freq: Every day | ORAL | 0 refills | Status: DC

## 2021-02-16 NOTE — Progress Notes (Signed)
Patient would like to discuss medication change. Patient has been out of country and is here for 6 month follow-up

## 2021-02-16 NOTE — Progress Notes (Signed)
Established  Patient Office Visit  Subjective:  Patient ID: Steve Drake, male    DOB: Sep 27, 1965  Age: 56 y.o. MRN: [id number]  CC:  Chief Complaint  Patient presents with   Follow-up    6 months    HPI CHANDLER SWIDERSKI presents for follow up of diabetes and hypertension. He would like a med that he is able to get in this country for diabetes. He will be leaving soon for a two month visit. He denies acute complaints.   Past Medical History:  Diagnosis Date   Diabetes mellitus without complication ([place])    Hypertension    Mixed hyperlipidemia 01/11/2019   Prediabetes 01/11/2019    Past Surgical History:  Procedure Laterality Date   COLONOSCOPY  over 10 years ago    [place]     over 10 years ago     Family History  Problem Relation Age of Onset   Diabetes Mother    Early death Father    Hypertension Brother    Colon cancer Neg Hx    Esophageal cancer Neg Hx    Rectal cancer Neg Hx    Stomach cancer Neg Hx    Colon polyps Neg Hx     Social History   Socioeconomic History   Marital status: Married    Spouse name: Not on file   Number of children: Not on file   Years of education: Not on file   Highest education level: Not on file  Occupational History   Not on file  Tobacco Use   Smoking status: Former   Smokeless tobacco: Never   Tobacco comments:    quit 10 years ago per pt  Vaping Use   Vaping Use: Never used  Substance and Sexual Activity   Alcohol use: Yes    Alcohol/week: 3.0 standard drinks    Types: 3 Cans of beer per week    Comment: sometimes   Drug use: Never   Sexual activity: Yes    Partners: Female  Other Topics Concern   Not on file  Social History Narrative   Not on file   Social Determinants of Health   Financial Resource Strain: Not on file  Food Insecurity: Not on file  Transportation Needs: Not on file  Physical Activity: Not on file  Stress: Not on file  Social Connections: Not on file  Intimate Partner Violence:  Not on file    ROS Review of Systems  All other systems reviewed and are negative.  Objective:   Today's Vitals: BP (!) 151/97    Pulse 70    Temp 98.1 F (36.7 C) (Oral)    Resp 16    Wt 185 lb 9.6 oz (84.2 kg)    SpO2 97%    BMI 28.52 kg/m   Physical Exam Vitals and nursing note reviewed.  Constitutional:      General: He is not in acute distress. Cardiovascular:     Rate and Rhythm: Normal rate and regular rhythm.  Pulmonary:     Effort: Pulmonary effort is normal.     Breath sounds: Normal breath sounds.  Abdominal:     Palpations: Abdomen is soft.     Tenderness: There is no abdominal tenderness.  Musculoskeletal:     Right lower leg: No edema.     Left lower leg: No edema.  Neurological:     General: No focal deficit present.     Mental Status: He is alert and oriented to person,  place, and time.    Assessment & Plan:   1. Type 2 diabetes mellitus with other specified complication, without long-term current use of insulin (HCC) A1c at goal. Janumet prescribed. monitor - POCT glycosylated hemoglobin (Hb A1C)  2. Essential hypertension Elevated reading. Discussed compliance. Losartan prescribed. monitor     Outpatient Encounter Medications as of 02/16/2021  Medication Sig   aspirin EC 81 MG tablet Take 81 mg by mouth daily.   DULoxetine (CYMBALTA) 60 MG capsule Take 60 mg by mouth daily.   lisinopril (ZESTRIL) 20 MG tablet Take 1 tablet by mouth once daily   metFORMIN (GLUCOPHAGE) 500 MG tablet TAKE 1 TABLET BY MOUTH TWICE DAILY WITH A MEAL . APPOINTMENT REQUIRED FOR FUTURE REFILLS   Omega-3 Fatty Acids (FISH OIL) 1000 MG CPDR Take 1,000 mg by mouth 2 (two) times daily.   [DISCONTINUED] losartan (COZAAR) 50 MG tablet Take 1 tablet (50 mg total) by mouth daily.   [DISCONTINUED] sitaGLIPtin-metformin (JANUMET) 50-500 MG tablet Take 1 tablet by mouth 2 (two) times daily with a meal.   losartan (COZAAR) 50 MG tablet Take 1 tablet (50 mg total) by mouth daily.    metFORMIN (GLUCOPHAGE) 1000 MG tablet Take 1 tablet (1,000 mg total) by mouth daily with breakfast.   sitaGLIPtin-metformin (JANUMET) 50-500 MG tablet Take 1 tablet by mouth 2 (two) times daily with a meal.   No facility-administered encounter medications on file as of 02/16/2021.    Follow-up: Return in about 4 weeks (around 03/16/2021).   Becky Sax, MD

## 2021-03-11 ENCOUNTER — Other Ambulatory Visit: Payer: Self-pay | Admitting: Family Medicine

## 2021-03-11 DIAGNOSIS — E1169 Type 2 diabetes mellitus with other specified complication: Secondary | ICD-10-CM

## 2021-05-04 ENCOUNTER — Ambulatory Visit: Payer: 59 | Admitting: Family Medicine

## 2021-09-14 ENCOUNTER — Other Ambulatory Visit: Payer: Self-pay | Admitting: Family Medicine

## 2021-09-14 DIAGNOSIS — R7303 Prediabetes: Secondary | ICD-10-CM

## 2021-09-14 DIAGNOSIS — E119 Type 2 diabetes mellitus without complications: Secondary | ICD-10-CM

## 2021-09-14 NOTE — Telephone Encounter (Signed)
Using [employer] [id number]. Patient called to advise that he's prescribed Metformin 500 mg BID on 02/11/21. He says he would like the 1000 mg prescribed to take once a day. He says he's been out of metformin x 3 days and his highest blood sugar 127 with no metformin. I asked about the Janumet prescribed, does he take that. He said he stopped because it made his legs hurt and he was tired, so he just took Metformin. I advised he's due for an appointment. He says he is going out of town/country in 10 days, so if he could be seen before then, he will come in . Advised no available appointments with any provider until 9/27, he says he will be out of town at that time and will not be back until January 12, 2022. Appointment scheduled with Dr. Redmond Pulling for 01/14/22. He asked would he be able to receive his Metformin, advised I will send this to Dr. Redmond Pulling for approval.

## 2021-09-14 NOTE — Telephone Encounter (Signed)
Medication Refill - Medication: metFORMIN (GLUCOPHAGE) 1000 MG tablet  Pt is requesting Rx for 3 months. Pt stated he is traveling and would like to have his medication. Pt stated he has been out of medication for three days.  Has the patient contacted their pharmacy? No. No, more refills.   (Agent: If no, request that the patient contact the pharmacy for the refill. If patient does not wish to contact the pharmacy document the reason why and proceed with request.)   Preferred Pharmacy (with phone number or street name):  [facility] ([address]), [place] [address]  [address] [place] [address]  Phone: [phone] Fax: [phone]  Hours: Not open 24 hours   Has the patient been seen for an appointment in the last year OR does the patient have an upcoming appointment? Yes.    Agent: Please be advised that RX refills may take up to 3 business days. We ask that you follow-up with your pharmacy.

## 2021-09-27 ENCOUNTER — Encounter: Payer: Self-pay | Admitting: Family Medicine

## 2021-09-27 ENCOUNTER — Ambulatory Visit (INDEPENDENT_AMBULATORY_CARE_PROVIDER_SITE_OTHER): Payer: BLUE CROSS/BLUE SHIELD | Admitting: Family Medicine

## 2021-09-27 VITALS — BP 154/99 | HR 67 | Ht 66.0 in | Wt 185.0 lb

## 2021-09-27 DIAGNOSIS — F419 Anxiety disorder, unspecified: Secondary | ICD-10-CM

## 2021-09-27 DIAGNOSIS — E1169 Type 2 diabetes mellitus with other specified complication: Secondary | ICD-10-CM

## 2021-09-27 DIAGNOSIS — I10 Essential (primary) hypertension: Secondary | ICD-10-CM | POA: Diagnosis not present

## 2021-09-27 DIAGNOSIS — Z789 Other specified health status: Secondary | ICD-10-CM

## 2021-09-27 DIAGNOSIS — F32A Depression, unspecified: Secondary | ICD-10-CM

## 2021-09-27 MED ORDER — AMLODIPINE BESYLATE 10 MG PO TABS: 10.0000 mg | ORAL_TABLET | Freq: Every day | ORAL | 1 refills | Status: DC

## 2021-09-27 MED ORDER — JANUMET 50-500 MG PO TABS: 1.0000 | ORAL_TABLET | Freq: Two times a day (BID) | ORAL | 0 refills | Status: DC

## 2021-09-27 MED ORDER — DULOXETINE HCL 60 MG PO CPEP: 60.0000 mg | ORAL_CAPSULE | Freq: Every day | ORAL | 1 refills | Status: DC

## 2021-09-30 ENCOUNTER — Encounter: Payer: Self-pay | Admitting: Family Medicine

## 2021-09-30 NOTE — Progress Notes (Addendum)
Established Patient Office Visit  Subjective    Patient ID: Steve Drake, male    DOB: 02-May-1965  Age: 56 y.o. MRN: [id number]  CC:  Chief Complaint  Patient presents with   Medication Refill    HPI Steve Drake presents for follow up of chronic med issues.    Outpatient Encounter Medications as of 09/27/2021  Medication Sig   amLODipine (NORVASC) 10 MG tablet Take 1 tablet (10 mg total) by mouth daily.   aspirin EC 81 MG tablet Take 81 mg by mouth daily. (Patient not taking: Reported on 09/27/2021)   DULoxetine (CYMBALTA) 60 MG capsule Take 1 capsule (60 mg total) by mouth daily.   lisinopril (ZESTRIL) 20 MG tablet Take 1 tablet by mouth once daily (Patient not taking: Reported on 09/27/2021)   losartan (COZAAR) 50 MG tablet Take 1 tablet (50 mg total) by mouth daily. (Patient not taking: Reported on 09/27/2021)   metFORMIN (GLUCOPHAGE) 1000 MG tablet Take 1 tablet (1,000 mg total) by mouth daily with breakfast.   metFORMIN (GLUCOPHAGE) 500 MG tablet TAKE 1 TABLET BY MOUTH TWICE DAILY WITH A MEAL . APPOINTMENT REQUIRED FOR FUTURE REFILLS (Patient not taking: Reported on 09/27/2021)   Omega-3 Fatty Acids (FISH OIL) 1000 MG CPDR Take 1,000 mg by mouth 2 (two) times daily. (Patient not taking: Reported on 09/27/2021)   sitaGLIPtin-metformin (JANUMET) 50-500 MG tablet Take 1 tablet by mouth 2 (two) times daily with a meal.   [DISCONTINUED] DULoxetine (CYMBALTA) 60 MG capsule Take 60 mg by mouth daily. (Patient not taking: Reported on 09/27/2021)   [DISCONTINUED] sitaGLIPtin-metformin (JANUMET) 50-500 MG tablet Take 1 tablet by mouth 2 (two) times daily with a meal. (Patient not taking: Reported on 09/27/2021)   No facility-administered encounter medications on file as of 09/27/2021.    Past Medical History:  Diagnosis Date   Diabetes mellitus without complication ([facility])    Hypertension    Mixed hyperlipidemia 01/11/2019   Prediabetes 01/11/2019    Past Surgical History:  Procedure  Laterality Date   COLONOSCOPY  over 10 years ago    [place]     over 10 years ago     Family History  Problem Relation Age of Onset   Diabetes Mother    Early death Father    Hypertension Brother    Colon cancer Neg Hx    Esophageal cancer Neg Hx    Rectal cancer Neg Hx    Stomach cancer Neg Hx    Colon polyps Neg Hx     Social History   Socioeconomic History   Marital status: Married    Spouse name: Not on file   Number of children: Not on file   Years of education: Not on file   Highest education level: Not on file  Occupational History   Not on file  Tobacco Use   Smoking status: Former   Smokeless tobacco: Never   Tobacco comments:    quit 10 years ago per pt  Vaping Use   Vaping Use: Never used  Substance and Sexual Activity   Alcohol use: Yes    Alcohol/week: 3.0 standard drinks of alcohol    Types: 3 Cans of beer per week    Comment: sometimes   Drug use: Never   Sexual activity: Yes    Partners: Female  Other Topics Concern   Not on file  Social History Narrative   Not on file   Social Determinants of Health   Financial Resource Strain:  Not on file  Food Insecurity: Not on file  Transportation Needs: Not on file  Physical Activity: Not on file  Stress: Not on file  Social Connections: Not on file  Intimate Partner Violence: Not on file    Review of Systems  All other systems reviewed and are negative.       Objective    BP (!) 154/99 (BP Location: Left Arm, Patient Position: Sitting)   Pulse 67   Ht '5\' 6"'$  (1.676 m)   Wt 185 lb (83.9 kg)   SpO2 97%   BMI 29.86 kg/m   Physical Exam Vitals and nursing note reviewed.  Constitutional:      General: He is not in acute distress. Cardiovascular:     Rate and Rhythm: Normal rate and regular rhythm.  Pulmonary:     Effort: Pulmonary effort is normal.     Breath sounds: Normal breath sounds.  Abdominal:     Palpations: Abdomen is soft.     Tenderness: There is no abdominal  tenderness.  Musculoskeletal:     Right lower leg: No edema.     Left lower leg: No edema.  Neurological:     General: No focal deficit present.     Mental Status: He is alert and oriented to person, place, and time.         Assessment & Plan:   1. Type 2 diabetes mellitus with other specified complication, without long-term current use of insulin ([place]) Continue present management. Meds refilled  2. Essential hypertension Discussed compliance. Continue.  3. Anxiety and depression Continue present management  4. Language barrier to communication     Return in about 3 months (around 12/27/2021) for follow up.   Becky Sax, MD

## 2021-10-05 ENCOUNTER — Other Ambulatory Visit: Payer: Self-pay | Admitting: *Deleted

## 2021-10-05 ENCOUNTER — Ambulatory Visit: Payer: Self-pay | Admitting: *Deleted

## 2021-10-05 DIAGNOSIS — E119 Type 2 diabetes mellitus without complications: Secondary | ICD-10-CM

## 2021-10-05 NOTE — Telephone Encounter (Signed)
Requested medication (s) are due for refill today: expired medication  Requested medication (s) are on the active medication list: yes  Last refill:  07/22/20-09/20/20 #60 0 refills  Future visit scheduled: yes in 3 months  Notes to clinic:  expired medication. Requesting to renew Rx. Patient reports he is not taking janumet due to cost and side effects. Please advise      Requested Prescriptions  Pending Prescriptions Disp Refills   metFORMIN (GLUCOPHAGE) 1000 MG tablet 60 tablet 0    Sig: Take 1 tablet (1,000 mg total) by mouth daily with breakfast.     Endocrinology:  Diabetes - Biguanides Failed - 10/05/2021  4:54 PM      Failed - Cr in normal range and within 360 days    Creatinine, Ser  Date Value Ref Range Status  07/22/2020 1.16 0.76 - 1.27 mg/dL Final         Failed - HBA1C is between 0 and 7.9 and within 180 days    Hemoglobin A1C  Date Value Ref Range Status  02/16/2021 6.7 (A) 4.0 - 5.6 % Final   Hgb A1c MFr Bld  Date Value Ref Range Status  01/10/2019 6.3 (H) 4.8 - 5.6 % Final    Comment:             Prediabetes: 5.7 - 6.4          Diabetes: >6.4          Glycemic control for adults with diabetes: <7.0          Failed - eGFR in normal range and within 360 days    GFR calc Af Amer  Date Value Ref Range Status  07/25/2019 75 >59 mL/min/1.73 Final    Comment:    **[facility] currently reports eGFR in compliance with the current**   recommendations of the [employer]. [facility] will   update reporting as new guidelines are published from the NKF-ASN   Task force.    GFR calc non Af Amer  Date Value Ref Range Status  07/25/2019 65 >59 mL/min/1.73 Final   eGFR  Date Value Ref Range Status  07/22/2020 73 >59 mL/min/1.73 Final         Failed - B12 Level in normal range and within 720 days    No results found for: "VITAMINB12"       Failed - CBC within normal limits and completed in the last 12 months    WBC  Date Value Ref Range Status   05/05/2019 8.9 4.0 - 10.5 K/uL Final   RBC  Date Value Ref Range Status  05/05/2019 5.42 4.22 - 5.81 MIL/uL Final   Hemoglobin  Date Value Ref Range Status  05/05/2019 15.8 13.0 - 17.0 g/dL Final  12/21/2017 16.1 13.0 - 17.7 g/dL Final   HCT  Date Value Ref Range Status  05/05/2019 48.1 39.0 - 52.0 % Final   Hematocrit  Date Value Ref Range Status  12/21/2017 49.0 37.5 - 51.0 % Final   MCHC  Date Value Ref Range Status  05/05/2019 32.8 30.0 - 36.0 g/dL Final   [facility]  Date Value Ref Range Status  05/05/2019 29.2 26.0 - 34.0 pg Final   MCV  Date Value Ref Range Status  05/05/2019 88.7 80.0 - 100.0 fL Final  12/21/2017 90 79 - 97 fL Final   No results found for: "PLTCOUNTKUC", "LABPLAT", "POCPLA" RDW  Date Value Ref Range Status  05/05/2019 11.8 11.5 - 15.5 % Final  12/21/2017 11.7 (L)  12.3 - 15.4 % Final    Comment:    **Effective January 08, 2018, the RDW pediatric reference**   interval will be removed and the adult reference interval   will be changing to:                             Male 11.7 - 15.4                                                      Male 11.6 - 15.4          Passed - Valid encounter within last 6 months    Recent Outpatient Visits           1 week ago Type 2 diabetes mellitus with other specified complication, without long-term current use of insulin [facility])   [facility] at [facility], MD   7 months ago Type 2 diabetes mellitus with other specified complication, without long-term current use of insulin [facility])   Primary Care at [facility], MD   1 year ago Type 2 diabetes mellitus without complication, without long-term current use of insulin [facility])   Primary Care at [facility] [place], NP   2 years ago Prediabetes   Primary Care at [facility], MD   2 years ago Prediabetes   Primary Care at [facility], MD       Future Appointments              In 3 months Dorna Mai, MD Primary Care at [facility]

## 2021-10-05 NOTE — Telephone Encounter (Signed)
  Chief Complaint: medication refill request for metformin 1000 mg prior to going out of country.  Symptoms: na  Frequency: na Pertinent Negatives: Patient denies na Disposition: '[]'$ ED /'[]'$ Urgent Care (no appt availability in office) / '[]'$ Appointment(In office/virtual)/ '[]'$  [place] [facility]/ '[]'$ Home Care/ '[]'$ Refused Recommended Disposition /'[]'$ [place] [facility]/ '[x]'$  Follow-up with PCP Additional Notes:   Patient reports during last OV 09/27/21 he requested 3 month supply of medications due to going out of the country this week. Patient reports pharmacy tried to refill janumet and he reports he can not take medication due to taking x 2 and felt "like mood changed and made feel down and low" . Blood glucose running in the 130's now. Reports janumet is too expensive . Patient would like metformin 1000 mg reordered . He has been taking his wife's medication because pharmacy will not refill metformin. Please advise and patient requesting a call back asap.    Reason for Disposition  [1] Caller has URGENT medicine question about med that PCP or specialist prescribed AND [2] triager unable to answer question  Answer Assessment - Initial Assessment Questions 1. NAME of MEDICINE: "What medicine(s) are you calling about?"     Metformin 1000 mg and janumet 2. QUESTION: "What is your question?" (e.g., double dose of medicine, side effect)     Can patient get a prescription for metformin 1000 mg for 90 day supply ? 3. PRESCRIBER: "Who prescribed the medicine?" Reason: if prescribed by specialist, call should be referred to that group.     PCP 4. SYMPTOMS: "Do you have any symptoms?" If Yes, ask: "What symptoms are you having?"  "How bad are the symptoms (e.g., mild, moderate, severe)     No sx. Patient will be going out of the country this week  5. PREGNANCY:  "Is there any chance that you are pregnant?" "When was your last menstrual period?"     na  Protocols used: Medication Question Call-A-AH

## 2021-10-06 NOTE — Telephone Encounter (Signed)
Patient request for medication change

## 2021-10-07 ENCOUNTER — Other Ambulatory Visit: Payer: Self-pay | Admitting: Family Medicine

## 2022-01-14 ENCOUNTER — Ambulatory Visit: Payer: Self-pay | Admitting: Family Medicine

## 2022-10-06 ENCOUNTER — Ambulatory Visit: Payer: Self-pay

## 2022-10-06 ENCOUNTER — Ambulatory Visit
Admission: RE | Admit: 2022-10-06 | Discharge: 2022-10-06 | Disposition: A | Discharge status: Home / Self Care | Payer: BLUE CROSS/BLUE SHIELD | Source: Ambulatory Visit | Attending: Physician Assistant | Admitting: Physician Assistant

## 2022-10-06 VITALS — BP 136/89 | HR 80 | Temp 98.3°F | Resp 18 | Ht 66.0 in | Wt 184.0 lb

## 2022-10-06 DIAGNOSIS — M25562 Pain in left knee: Secondary | ICD-10-CM

## 2022-10-06 DIAGNOSIS — E119 Type 2 diabetes mellitus without complications: Secondary | ICD-10-CM

## 2022-10-06 DIAGNOSIS — G8929 Other chronic pain: Secondary | ICD-10-CM

## 2022-10-06 DIAGNOSIS — M25561 Pain in right knee: Secondary | ICD-10-CM | POA: Diagnosis not present

## 2022-10-06 MED ORDER — NAPROXEN 500 MG PO TABS: 500.0000 mg | ORAL_TABLET | Freq: Two times a day (BID) | ORAL | 0 refills | Status: DC

## 2022-10-06 MED ORDER — METFORMIN HCL 1000 MG PO TABS: 1000.0000 mg | ORAL_TABLET | Freq: Every day | ORAL | 0 refills | Status: DC

## 2022-10-06 NOTE — ED Triage Notes (Signed)
Due to language barrier, an interpreter was present during the history-taking and subsequent discussion (and for part of the physical exam) with this patient. Steve Drake. Number: [id number].   "I am having pain in both of my knees, more on the right, I can barely walk, it has been a few days, this is something new (never happened before). No injury known. No redness or swelling.

## 2022-10-06 NOTE — ED Provider Notes (Signed)
Steve Drake    CSN: [id number] Arrival date & time: 10/06/22  1241      History   Chief Complaint Chief Complaint  Patient presents with   Knee Pain    Entered by patient    HPI Steve Drake is a 57 y.o. male.   Patient here today for evaluation of bilateral knee pain.  He states he has more pain on the right than the left.  He denies any injury.  He states that pain is so significant at times he has difficulty walking.  He states that pain is present bilaterally to more medial joint lines.  He has not had any numbness or tingling.  He denies any redness or swelling.  He has not tried any medication for symptoms.  Patient also request refill of metformin.  He has not seen primary Drake in over a year.  The history is provided by the patient.  Knee Pain Associated symptoms: no fever     Past Medical History:  Diagnosis Date   Diabetes mellitus without complication ([facility])    Hypertension    Mixed hyperlipidemia 01/11/2019   Prediabetes 01/11/2019    Patient Active Problem List   Diagnosis Date Noted   Type 2 diabetes mellitus ([facility]) 07/22/2020   Prediabetes 01/11/2019   Mixed hyperlipidemia 01/11/2019    Past Surgical History:  Procedure Laterality Date   COLONOSCOPY  over 10 years ago    HERNIA REPAIR     over 10 years ago        Home Medications    Prior to Admission medications   Medication Sig Start Date End Date Taking? Authorizing Provider  naproxen (NAPROSYN) 500 MG tablet Take 1 tablet (500 mg total) by mouth 2 (two) times daily. 10/06/22  Yes Tomi Bamberger, PA-C  amLODipine (NORVASC) 10 MG tablet Take 1 tablet (10 mg total) by mouth daily. Patient taking differently: Take 10 mg by mouth daily. [place] stopped [place]. 09/27/21   Georganna Skeans, MD  aspirin EC 81 MG tablet Take 81 mg by mouth daily.    [provider]  DULoxetine (CYMBALTA) 60 MG capsule Take 1 capsule (60 mg total) by mouth daily. 09/27/21   Georganna Skeans, MD  lisinopril (ZESTRIL) 20 MG tablet Take 1 tablet by mouth once daily Patient not taking: Reported on 09/27/2021 09/21/20   Georganna Skeans, MD  losartan (COZAAR) 50 MG tablet Take 1 tablet (50 mg total) by mouth daily. Patient not taking: Reported on 09/27/2021 02/16/21   Georganna Skeans, MD  metFORMIN (GLUCOPHAGE) 1000 MG tablet Take 1 tablet (1,000 mg total) by mouth daily with breakfast. 10/06/22 11/05/22  Tomi Bamberger, PA-C    Family History Family History  Problem Relation Age of Onset   Diabetes Mother    Early death Father    Hypertension Brother    Colon cancer Neg Hx    Esophageal cancer Neg Hx    Rectal cancer Neg Hx    Stomach cancer Neg Hx    Colon polyps Neg Hx     Social History Social History   Tobacco Use   Smoking status: Former    Types: Cigarettes   Smokeless tobacco: Never   Tobacco comments:    quit 10 years ago per pt  Vaping Use   Vaping status: Never Used  Substance Use Topics   Alcohol use: Yes    Alcohol/week: 3.0 standard drinks of alcohol    Types: 3 Cans of beer  per week    Comment: sometimes   Drug use: Never     Allergies   Almond (diagnostic) and Apple juice   Review of Systems Review of Systems  Constitutional:  Negative for chills and fever.  Eyes:  Negative for discharge and redness.  Respiratory:  Negative for shortness of breath.   Musculoskeletal:  Positive for arthralgias. Negative for joint swelling.  Neurological:  Negative for numbness.     Physical Exam Triage Vital Signs ED Triage Vitals  Encounter Vitals Group     BP 10/06/22 1253 (!) 145/90     Systolic BP Percentile --      Diastolic BP Percentile --      Pulse Rate 10/06/22 1253 80     Resp 10/06/22 1253 18     Temp 10/06/22 1253 98.3 F (36.8 C)     Temp Source 10/06/22 1253 Oral     SpO2 10/06/22 1253 96 %     Weight 10/06/22 1252 184 lb (83.5 kg)     Height 10/06/22 1252 5\' 6"  (1.676 m)     Head Circumference --      Peak Flow --       Pain Score 10/06/22 1245 10     Pain Loc --      Pain Education --      Exclude from Growth Chart --    No data found.  Updated Vital Signs BP 136/89 (BP Location: Left Arm)   Pulse 80   Temp 98.3 F (36.8 C) (Oral)   Resp 18   Ht 5\' 6"  (1.676 m)   Wt 184 lb (83.5 kg)   SpO2 96%   BMI 29.70 kg/m      Physical Exam Vitals and nursing note reviewed.  Constitutional:      General: He is not in acute distress.    Appearance: Normal appearance. He is not ill-appearing.  HENT:     Head: Normocephalic and atraumatic.  Eyes:     Conjunctiva/sclera: Conjunctivae normal.  Cardiovascular:     Rate and Rhythm: Normal rate.  Pulmonary:     Effort: Pulmonary effort is normal. No respiratory distress.  Musculoskeletal:     Comments: Full range of motion of bilateral knees with tenderness palpation to bilateral knees at medial joint line only.  No swelling noted  Neurological:     Mental Status: He is alert.  Psychiatric:        Mood and Affect: Mood normal.        Behavior: Behavior normal.        Thought Content: Thought content normal.      UC Treatments / Results  Labs (all labs ordered are listed, but only abnormal results are displayed) Labs Reviewed - No data to display  EKG   Radiology No results found.  Procedures Procedures (including critical Drake time)  Medications Ordered in UC Medications - No data to display  Initial Impression / Assessment and Plan / UC Course  I have reviewed the triage vital signs and the nursing notes.  Pertinent labs & imaging results that were available during my Drake of the patient were reviewed by me and considered in my medical decision making (see chart for details).    Unclear etiology of knee pain but suspicious for arthritis given age.  Recommended naproxen daily and encouraged follow-up with Ortho should symptoms fail to improve or worsen.  Metformin refilled as requested.  Recommended follow-up with primary Drake soon  as possible.  Final  Clinical Impressions(s) / UC Diagnoses   Final diagnoses:  Chronic pain of both knees  Type 2 diabetes mellitus without complication, without long-term current use of insulin [facility])     Discharge Instructions       Please follow up with primary Drake as soon as possible.      ED Prescriptions     Medication Sig Dispense Auth. Provider   metFORMIN (GLUCOPHAGE) 1000 MG tablet Take 1 tablet (1,000 mg total) by mouth daily with breakfast. 30 tablet Erma Pinto F, PA-C   naproxen (NAPROSYN) 500 MG tablet Take 1 tablet (500 mg total) by mouth 2 (two) times daily. 30 tablet Tomi Bamberger, PA-C      PDMP not reviewed this encounter.   Tomi Bamberger, PA-C 10/06/22 1601

## 2022-10-06 NOTE — Telephone Encounter (Signed)
Using [facility] WU#[id number].   Chief Complaint: Knee pain  Symptoms: bilateral moderate to  severe knee pain 8/10 when walking Frequency: comes and goes  Pertinent Negatives: Patient denies injury, swelling, redness Disposition: [] ED /[x] Urgent Care (no appt availability in office) / [] Appointment(In office/virtual)/ []  [place] [facility]/ [] Home Care/ [] Refused Recommended Disposition /[] [place] [facility]/ []  Follow-up with PCP Additional Notes: Patient states bilateral knee pain started yesterday with no known injury to either knees. Patient states the right knee usual hurts more than the left. Patient denies swelling or redness to the knees but states they do hurt when he touches the area. Patient states he did not take over the counter pain medicine due to have a Hx of fatty liver. Care advice given and patient requested an appointment. No appointments available in the office so patient has been schedule at Urgent Care today at 1300.  Reason for Disposition  [1] MODERATE pain (e.g., interferes with normal activities, limping) AND [2] present > 3 days  Answer Assessment - Initial Assessment Questions 1. LOCATION and RADIATION: "Where is the pain located?"      Bilateral Knees, right knee has more pain  2. QUALITY: "What does the pain feel like?"  (e.g., sharp, dull, aching, burning)     I can't explain but it makes me weak 3. SEVERITY: "How bad is the pain?" "What does it keep you from doing?"   (Scale 1-10; or mild, moderate, severe)   -  MILD (1-3): doesn't interfere with normal activities    -  MODERATE (4-7): interferes with normal activities (e.g., work or school) or awakens from sleep, limping    -  SEVERE (8-10): excruciating pain, unable to do any normal activities, unable to walk     8/10 4. ONSET: "When did the pain start?" "Does it come and go, or is it there all the time?"     Yesterday, comes and goes  5. RECURRENT: "Have you had this pain before?" If  Yes, ask: "When, and what happened then?"     No  6. SETTING: "Has there been any recent work, exercise or other activity that involved that part of the body?"      I work everyday  7. AGGRAVATING FACTORS: "What makes the knee pain worse?" (e.g., walking, climbing stairs, running)     Walking 8. ASSOCIATED SYMPTOMS: "Is there any swelling or redness of the knee?"     No  9. OTHER SYMPTOMS: "Do you have any other symptoms?" (e.g., chest pain, difficulty breathing, fever, calf pain)     When I touch them they hurt  Protocols used: Knee Pain-A-AH

## 2022-10-06 NOTE — Discharge Instructions (Signed)
Please follow up with primary care as soon as possible.

## 2022-12-05 ENCOUNTER — Encounter: Payer: BLUE CROSS/BLUE SHIELD | Admitting: Family Medicine

## 2023-06-21 ENCOUNTER — Ambulatory Visit
Admission: EM | Admit: 2023-06-21 | Discharge: 2023-06-21 | Disposition: A | Discharge status: Home / Self Care | Attending: Nurse Practitioner | Admitting: Nurse Practitioner

## 2023-06-21 ENCOUNTER — Encounter: Payer: Self-pay | Admitting: Emergency Medicine

## 2023-06-21 DIAGNOSIS — I10 Essential (primary) hypertension: Secondary | ICD-10-CM | POA: Diagnosis not present

## 2023-06-21 DIAGNOSIS — M545 Low back pain, unspecified: Secondary | ICD-10-CM

## 2023-06-21 DIAGNOSIS — R300 Dysuria: Secondary | ICD-10-CM | POA: Diagnosis not present

## 2023-06-21 LAB — POCT URINALYSIS DIP (MANUAL ENTRY)
Bilirubin, UA: NEGATIVE
Blood, UA: NEGATIVE
Glucose, UA: NEGATIVE mg/dL
Ketones, POC UA: NEGATIVE mg/dL
Leukocytes, UA: NEGATIVE
Nitrite, UA: NEGATIVE
Protein Ur, POC: NEGATIVE mg/dL
Spec Grav, UA: 1.02 (ref 1.010–1.025)
Urobilinogen, UA: 0.2 U/dL
pH, UA: 7 (ref 5.0–8.0)

## 2023-06-21 MED ORDER — CIPROFLOXACIN HCL 500 MG PO TABS: 500.0000 mg | ORAL_TABLET | Freq: Two times a day (BID) | ORAL | 0 refills | Status: AC

## 2023-06-21 MED ORDER — NAPROXEN 500 MG PO TABS: 500.0000 mg | ORAL_TABLET | Freq: Two times a day (BID) | ORAL | 0 refills | Status: AC

## 2023-06-21 MED ORDER — PHENAZOPYRIDINE HCL 200 MG PO TABS: 200.0000 mg | ORAL_TABLET | ORAL | 0 refills | Status: AC

## 2023-06-21 NOTE — Discharge Instructions (Addendum)
 Est recibiendo tratamiento por sospecha de prostatitis debido a sus sntomas de dolor en la parte baja de la espalda, malestar al orinar y sensacin de vaciado incompleto de la vejiga. Su anlisis de [place] no mostr signos de infeccin, Biomedical engineer los sntomas sugieren inflamacin de la prstata. Se le ha recetado ciprofloxacina para Armed forces training and education officer al da durante [address] Tome todas las dosis segn las indicaciones y complete el tratamiento completo. Tambin se le recet Pyridium para Technical sales engineer sensacin de ardor o Dentist al Geographical information systems officer; este medicamento puede teir la orina de color naranja, lo cual es normal. Naproxeno fue recetado para Chief Technology Officer de espalda; no tome medicamentos antiinflamatorios de venta libre ni aspirina mientras est tomando [employer]. Se realiz un hisopado del pene para analizar posibles infecciones; se le notificar si hay resultados anormales. Beba mucha agua, evite el alcohol, la cafena y los alimentos picantes, y tome los medicamentos con comida para Astronomer. Vigile sus sntomas de cerca. Programe una cita de seguimiento con su proveedor de atencin primaria para Community education officer evaluacin y el manejo, incluyendo su presin arterial. Su presin arterial estuvo elevada hoy y debe mantenerse por debajo de 130/70 mmHg. Acuda a emergencias si desarrolla fiebre, dolor severo o en aumento, dificultad para orinar, sangre en la orina o signos de presin arterial muy alta como dolor en el pecho, cambios en la visin o dolor de cabeza intenso.  You are being treated for suspected prostatitis based on your symptoms of lower back pain, discomfort with urination, and a feeling of incomplete bladder emptying. Your urinalysis did not show signs of infection, but symptoms suggest inflammation of the prostate. You have been prescribed ciprofloxacin to take twice daily for 14 days. Take all doses as directed and complete the full course. Pyridium was prescribed to help relieve burning  or discomfort during urination; this may turn your urine orange, which is expected. Naproxen  was prescribed for back pain; do not take any additional over-the-counter NSAIDs or aspirin while using this medication. A penile swab was performed to test for any other possible infections, and you will be notified of any abnormal results. Drink plenty of water, avoid alcohol, caffeine, and spicy foods, and take medications with food to reduce stomach upset. Monitor your symptoms closely. Follow up with your primary care provider for continued care and evaluation, including blood pressure management. Your blood pressure was elevated today and should be monitored with a goal of 130/70 mmHg or less. Seek emergency care if you develop fever, severe or worsening pain, difficulty urinating, blood in urine, or signs of very high blood pressure such as chest pain, vision changes, or a severe headache.

## 2023-06-21 NOTE — ED Provider Notes (Signed)
 [facility]    CSN: [id number] Arrival date & time: 06/21/23  0936      History   Chief Complaint Chief Complaint  Patient presents with   Back Pain   Burning when urinating     HPI Steve Drake is a 58 y.o. male.   Patient is Spanish Speaking; interpreter Bonnita Buttner [phone]   Steve Drake is a 58 y.o. male that presents with lower back pain, dysuria, and urinary frequency for over a week. The patient reports experiencing awful back pain primarily in the lower left back, which has persisted for more than a week. He describes a burning sensation when urinating and a feeling of incomplete bladder emptying after voiding. The back pain is localized and does not radiate down the buttocks or legs.  Regarding urinary symptoms, the patient reports inconsistent urine flow. He denies any blood in the urine, penile discharge, or pain in the penis or genitals. The patient also denies experiencing nausea, vomiting, abdominal pain, or fevers. The patient is not currently sexually active and reports no sexual activity within the past three months. He has no known history of prostate problems. The patient takes metformin  daily but no blood pressure medications. He was previously on three different blood pressure medications several years ago prescribed by his then PCP, A. Wilson, but reports that he didn't like how the medicines made him feel. He subsequently saw a physician in his home country who told him that he does not need to take any medicines for blood pressures. He reports checking his blood pressure daily, with typical systolic readings between 139-149.   The following portions of the patient's history were reviewed and updated as appropriate: allergies, current medications, past family history, past medical history, past social history, past surgical history, and problem list     Past Medical History:  Diagnosis Date   Diabetes mellitus without complication (HCC)     Hypertension    Mixed hyperlipidemia 01/11/2019   Prediabetes 01/11/2019    Patient Active Problem List   Diagnosis Date Noted   Type 2 diabetes mellitus (HCC) 07/22/2020   Prediabetes 01/11/2019   Mixed hyperlipidemia 01/11/2019    Past Surgical History:  Procedure Laterality Date   COLONOSCOPY  over 10 years ago    HERNIA REPAIR     over 10 years ago        Home Medications    Prior to Admission medications   Medication Sig Start Date End Date Taking? Authorizing Provider  ciprofloxacin (CIPRO) 500 MG tablet Take 1 tablet (500 mg total) by mouth every 12 (twelve) hours for 14 days. 06/21/23 07/05/23 Yes Maryruth Sol, FNP  naproxen  (NAPROSYN ) 500 MG tablet Take 1 tablet (500 mg total) by mouth 2 (two) times daily with a meal. 06/21/23  Yes Maryruth Sol, FNP  phenazopyridine (PYRIDIUM) 200 MG tablet Take 1 tablet (200 mg total) by mouth 3 (three) times daily at 8am, 3pm and bedtime for 2 days. 06/21/23 06/23/23 Yes Maryruth Sol, FNP  metFORMIN  (GLUCOPHAGE ) 1000 MG tablet Take 1 tablet (1,000 mg total) by mouth daily with breakfast. 10/06/22 11/05/22  Vernestine Gondola, PA-C    Family History Family History  Problem Relation Age of Onset   Diabetes Mother    Early death Father    Hypertension Brother    Colon cancer Neg Hx    Esophageal cancer Neg Hx    Rectal cancer Neg Hx    Stomach cancer Neg Hx    Colon polyps  Neg Hx     Social History Social History   Tobacco Use   Smoking status: Former    Types: Cigarettes    Passive exposure: Past   Smokeless tobacco: Never   Tobacco comments:    quit 10 years ago per pt  Vaping Use   Vaping status: Never Used  Substance Use Topics   Alcohol use: Yes    Alcohol/week: 3.0 standard drinks of alcohol    Types: 3 Cans of beer per week    Comment: sometimes   Drug use: Never     Allergies   Almond (diagnostic) and Apple juice   Review of Systems Review of Systems  Constitutional:  Negative for fever.   Gastrointestinal:  Negative for abdominal pain, nausea and vomiting.       No bowel incontinence   Genitourinary:  Positive for dysuria. Negative for difficulty urinating, hematuria, penile discharge, penile pain, penile swelling, scrotal swelling, testicular pain and urgency.       No bladder incontinence   Musculoskeletal:  Positive for back pain (left lower).  Neurological:  Negative for weakness and numbness.  All other systems reviewed and are negative.    Physical Exam Triage Vital Signs ED Triage Vitals  Encounter Vitals Group     BP 06/21/23 1019 (!) 168/102     Girls Systolic BP Percentile --      Girls Diastolic BP Percentile --      Boys Systolic BP Percentile --      Boys Diastolic BP Percentile --      Pulse Rate 06/21/23 1019 (!) 59     Resp 06/21/23 1019 18     Temp 06/21/23 1019 98 F (36.7 C)     Temp Source 06/21/23 1019 Oral     SpO2 06/21/23 1019 98 %     Weight 06/21/23 1018 184 lb 1.4 oz (83.5 kg)     Height --      Head Circumference --      Peak Flow --      Pain Score 06/21/23 1015 9     Pain Loc --      Pain Education --      Exclude from Growth Chart --    No data found.  Updated Vital Signs BP (!) 168/102 (BP Location: Left Arm)   Pulse (!) 59   Temp 98 F (36.7 C) (Oral)   Resp 18   Wt 184 lb 1.4 oz (83.5 kg)   SpO2 98%   BMI 29.71 kg/m   Visual Acuity Right Eye Distance:   Left Eye Distance:   Bilateral Distance:    Right Eye Near:   Left Eye Near:    Bilateral Near:     Physical Exam Vitals reviewed.  Constitutional:      General: He is not in acute distress.    Appearance: Normal appearance. He is not ill-appearing, toxic-appearing or diaphoretic.  HENT:     Head: Normocephalic.     Nose: Nose normal.     Mouth/Throat:     Mouth: Mucous membranes are moist.   Eyes:     Conjunctiva/sclera: Conjunctivae normal.    Cardiovascular:     Rate and Rhythm: Normal rate.  Pulmonary:     Effort: Pulmonary effort is  normal.  Abdominal:     Palpations: Abdomen is soft.  Genitourinary:    Comments: Deferred; patient performed self-swab for Aptima testing   Musculoskeletal:        General: Normal range  of motion.     Cervical back: Normal range of motion and neck supple.   Skin:    General: Skin is warm and dry.   Neurological:     General: No focal deficit present.     Mental Status: He is alert and oriented to person, place, and time.   Psychiatric:        Mood and Affect: Mood normal.        Behavior: Behavior normal.      UC Treatments / Results  Labs (all labs ordered are listed, but only abnormal results are displayed) Labs Reviewed  POCT URINALYSIS DIP (MANUAL ENTRY)  CYTOLOGY, (ORAL, ANAL, URETHRAL) ANCILLARY ONLY    EKG   Radiology No results found.  Procedures Procedures (including critical care time)  Medications Ordered in UC Medications - No data to display  Initial Impression / Assessment and Plan / UC Course  I have reviewed the triage vital signs and the nursing notes.  Pertinent labs & imaging results that were available during my care of the patient were reviewed by me and considered in my medical decision making (see chart for details).     The patient presents with lower back pain, dysuria, and a sensation of incomplete bladder emptying. Urinalysis was negative for infection, and the patient denies systemic symptoms such as fever, abdominal pain, nausea, or vomiting. Given the clinical presentation and negative urinalysis, prostatitis is suspected. The patient also denies recent sexual activity, lowering the suspicion for STIs. Ciprofloxacin was prescribed twice daily for 14 days for suspected prostatitis, along with pyridium for urinary discomfort and naproxen  for back pain, with instructions to avoid additional aspirin or over-the-counter NSAIDs while on treatment. A penile swab was performed to rule out infections, and the patient was advised to follow up  with their primary care provider for ongoing evaluation and care. Blood pressure was elevated in clinic at 168/102, with reported home readings ranging from 139-149 systolic. The patient is not currently on antihypertensive medications and has not followed up with a primary care provider since September 2023. No medication was started today, but the patient was advised to follow up with primary care for blood pressure management and to aim for a goal of 130/70 mmHg or lower. Return to the emergency department if symptoms worsen, new fever develops, urinary retention occurs, or blood pressure becomes dangerously elevated with chest pain, headache, or visual changes.  Today's evaluation has revealed no signs of a dangerous process. Discussed diagnosis with patient and/or guardian. Patient and/or guardian aware of their diagnosis, possible red flag symptoms to watch out for and need for close follow up. Patient and/or guardian understands verbal and written discharge instructions. Patient and/or guardian comfortable with plan and disposition.  Patient and/or guardian has a clear mental status at this time, good insight into illness (after discussion and teaching) and has clear judgment to make decisions regarding their care  Documentation was completed with the aid of voice recognition software. Transcription may contain typographical errors. Final Clinical Impressions(s) / UC Diagnoses   Final diagnoses:  Acute left-sided low back pain without sciatica  Dysuria  Elevated blood pressure reading with diagnosis of hypertension     Discharge Instructions      Est recibiendo tratamiento por sospecha de prostatitis debido a sus sntomas de dolor en la parte baja de la espalda, malestar al orinar y sensacin de vaciado incompleto de la vejiga. Su anlisis de [place] no mostr signos de infeccin, Biomedical engineer los sntomas sugieren inflamacin  de la prstata. Se le ha recetado ciprofloxacina para Armed forces training and education officer al  da durante [address]. Tome todas las dosis segn las indicaciones y complete el tratamiento completo. Tambin se le recet Pyridium para Technical sales engineer sensacin de ardor o Dentist al Geographical information systems officer; este medicamento puede teir la orina de color naranja, lo cual es normal. Naproxeno fue recetado para Chief Technology Officer de espalda; no tome medicamentos antiinflamatorios de venta libre ni aspirina mientras est tomando [employer]. Se realiz un hisopado del pene para analizar posibles infecciones; se le notificar si hay resultados anormales. Beba mucha agua, evite el alcohol, la cafena y los alimentos picantes, y tome los medicamentos con comida para Astronomer. Vigile sus sntomas de cerca. Programe una cita de seguimiento con su proveedor de atencin primaria para Community education officer evaluacin y el manejo, incluyendo su presin arterial. Su presin arterial estuvo elevada hoy y debe mantenerse por debajo de 130/70 mmHg. Acuda a emergencias si desarrolla fiebre, dolor severo o en aumento, dificultad para orinar, sangre en la orina o signos de presin arterial muy alta como dolor en el pecho, cambios en la visin o dolor de cabeza intenso.  You are being treated for suspected prostatitis based on your symptoms of lower back pain, discomfort with urination, and a feeling of incomplete bladder emptying. Your urinalysis did not show signs of infection, but symptoms suggest inflammation of the prostate. You have been prescribed ciprofloxacin to take twice daily for 14 days. Take all doses as directed and complete the full course. Pyridium was prescribed to help relieve burning or discomfort during urination; this may turn your urine orange, which is expected. Naproxen  was prescribed for back pain; do not take any additional over-the-counter NSAIDs or aspirin while using this medication. A penile swab was performed to test for any other possible infections, and you will be notified of any abnormal results. Drink plenty of  water, avoid alcohol, caffeine, and spicy foods, and take medications with food to reduce stomach upset. Monitor your symptoms closely. Follow up with your primary care provider for continued care and evaluation, including blood pressure management. Your blood pressure was elevated today and should be monitored with a goal of 130/70 mmHg or less. Seek emergency care if you develop fever, severe or worsening pain, difficulty urinating, blood in urine, or signs of very high blood pressure such as chest pain, vision changes, or a severe headache.      ED Prescriptions     Medication Sig Dispense Auth. Provider   ciprofloxacin (CIPRO) 500 MG tablet Take 1 tablet (500 mg total) by mouth every 12 (twelve) hours for 14 days. 28 tablet Maryruth Sol, FNP   phenazopyridine (PYRIDIUM) 200 MG tablet Take 1 tablet (200 mg total) by mouth 3 (three) times daily at 8am, 3pm and bedtime for 2 days. 6 tablet Maryruth Sol, FNP   naproxen  (NAPROSYN ) 500 MG tablet Take 1 tablet (500 mg total) by mouth 2 (two) times daily with a meal. 20 tablet Maryruth Sol, FNP      PDMP not reviewed this encounter.   Maryruth Sol, [place] 06/21/23 1126

## 2023-06-21 NOTE — ED Triage Notes (Signed)
 [name] ID [phone]  Pt presents c/o back pain that onset over two weeks ago and also states he has a burn when urinating. Pt reports his urination schedule has not changed.

## 2023-06-22 LAB — CYTOLOGY, (ORAL, ANAL, URETHRAL) ANCILLARY ONLY
Chlamydia: NEGATIVE
Comment: NEGATIVE
Comment: NEGATIVE
Comment: NORMAL
Neisseria Gonorrhea: NEGATIVE
Trichomonas: NEGATIVE

## 2023-06-23 ENCOUNTER — Ambulatory Visit: Payer: Self-pay | Admitting: Nurse Practitioner

## 2023-07-11 ENCOUNTER — Ambulatory Visit (INDEPENDENT_AMBULATORY_CARE_PROVIDER_SITE_OTHER): Admitting: Family Medicine

## 2023-07-11 ENCOUNTER — Encounter: Payer: Self-pay | Admitting: Family Medicine

## 2023-07-11 VITALS — BP 163/102 | HR 72 | Ht 66.0 in | Wt 185.6 lb

## 2023-07-11 DIAGNOSIS — Z1329 Encounter for screening for other suspected endocrine disorder: Secondary | ICD-10-CM

## 2023-07-11 DIAGNOSIS — Z0000 Encounter for general adult medical examination without abnormal findings: Secondary | ICD-10-CM | POA: Diagnosis not present

## 2023-07-11 DIAGNOSIS — Z130 Encounter for screening for diseases of the blood and blood-forming organs and certain disorders involving the immune mechanism: Secondary | ICD-10-CM

## 2023-07-11 DIAGNOSIS — Z125 Encounter for screening for malignant neoplasm of prostate: Secondary | ICD-10-CM | POA: Diagnosis not present

## 2023-07-11 DIAGNOSIS — Z13228 Encounter for screening for other metabolic disorders: Secondary | ICD-10-CM

## 2023-07-11 DIAGNOSIS — Z13220 Encounter for screening for lipoid disorders: Secondary | ICD-10-CM | POA: Diagnosis not present

## 2023-07-11 DIAGNOSIS — E1169 Type 2 diabetes mellitus with other specified complication: Secondary | ICD-10-CM | POA: Diagnosis not present

## 2023-07-11 DIAGNOSIS — Z789 Other specified health status: Secondary | ICD-10-CM

## 2023-07-11 LAB — POCT URINALYSIS DIP (CLINITEK)
Bilirubin, UA: NEGATIVE
Glucose, UA: NEGATIVE mg/dL
Ketones, POC UA: NEGATIVE mg/dL
Leukocytes, UA: NEGATIVE
Nitrite, UA: NEGATIVE
POC PROTEIN,UA: NEGATIVE
Spec Grav, UA: >=1.03 — AB (ref 1.010–1.025)
Urobilinogen, UA: 0.2 U/dL
pH, UA: 6 (ref 5.0–8.0)

## 2023-07-11 LAB — POCT GLYCOSYLATED HEMOGLOBIN (HGB A1C): HbA1c, POC (controlled diabetic range): 6.8 % (ref 0.0–7.0)

## 2023-07-11 MED ORDER — TAMSULOSIN HCL 0.4 MG PO CAPS: 0.4000 mg | ORAL_CAPSULE | Freq: Every day | ORAL | 3 refills | Status: AC

## 2023-07-11 NOTE — Addendum Note (Signed)
 Addended by: Arely Tinner E on: 07/11/2023 04:55 PM   Modules accepted: Orders

## 2023-07-11 NOTE — Progress Notes (Signed)
 Established Patient Office Visit  Subjective    Patient ID: Steve Drake, male    DOB: [birth year]  Age: 58 y.o. MRN: [id number]  CC:  Chief Complaint  Patient presents with   Medical Management of Chronic Issues    No concerns     HPI LOMAX POEHLER presents for routine annual exam.Patient reports med compliance.  Patient does report that he has to go to bathroom 2 times or so at night to urinate. This visit was aided by an interpreter.   Outpatient Encounter Medications as of 07/11/2023  Medication Sig   metFORMIN  (GLUCOPHAGE ) 1000 MG tablet Take 1 tablet (1,000 mg total) by mouth daily with breakfast.   naproxen  (NAPROSYN ) 500 MG tablet Take 1 tablet (500 mg total) by mouth 2 (two) times daily with a meal. (Patient not taking: Reported on 07/11/2023)   No facility-administered encounter medications on file as of 07/11/2023.    Past Medical History:  Diagnosis Date   Diabetes mellitus without complication (HCC)    Hypertension    Mixed hyperlipidemia 01/11/2019   Prediabetes 01/11/2019    Past Surgical History:  Procedure Laterality Date   COLONOSCOPY  over 10 years ago    HERNIA REPAIR     over 10 years ago     Family History  Problem Relation Age of Onset   Diabetes Mother    Early death Father    Hypertension Brother    Colon cancer Neg Hx    Esophageal cancer Neg Hx    Rectal cancer Neg Hx    Stomach cancer Neg Hx    Colon polyps Neg Hx     Social History   Socioeconomic History   Marital status: Married    Spouse name: Not on file   Number of children: Not on file   Years of education: Not on file   Highest education level: Not on file  Occupational History   Not on file  Tobacco Use   Smoking status: Former    Types: Cigarettes    Passive exposure: Past   Smokeless tobacco: Never   Tobacco comments:    quit 10 years ago per pt  Vaping Use   Vaping status: Never Used  Substance and Sexual Activity   Alcohol use: Yes    Alcohol/week: 3.0  standard drinks of alcohol    Types: 3 Cans of beer per week    Comment: sometimes   Drug use: Never   Sexual activity: Yes    Partners: Female    Birth control/protection: None  Other Topics Concern   Not on file  Social History Narrative   Not on file   Social Drivers of Health   Financial Resource Strain: Not on file  Food Insecurity: Not on file  Transportation Needs: Not on file  Physical Activity: Not on file  Stress: Not on file  Social Connections: Not on file  Intimate Partner Violence: Not on file    Review of Systems  All other systems reviewed and are negative.       Objective    BP (!) 163/102   Pulse 72   Ht 5' 6 (1.676 m)   Wt 185 lb 9.6 oz (84.2 kg)   SpO2 96%   BMI 29.96 kg/m   Physical Exam Vitals and nursing note reviewed.  Constitutional:      General: He is not in acute distress. HENT:     Head: Normocephalic and atraumatic.     Right Ear:  Tympanic membrane, ear canal and external ear normal.     Left Ear: Tympanic membrane, ear canal and external ear normal.     Nose: Nose normal.     Mouth/Throat:     Mouth: Mucous membranes are moist.     Pharynx: Oropharynx is clear.  Eyes:     Conjunctiva/sclera: Conjunctivae normal.     Pupils: Pupils are equal, round, and reactive to light.  Neck:     Thyroid : No thyromegaly.  Cardiovascular:     Rate and Rhythm: Normal rate and regular rhythm.     Heart sounds: Normal heart sounds. No murmur heard. Pulmonary:     Effort: Pulmonary effort is normal.     Breath sounds: Normal breath sounds.  Abdominal:     General: There is no distension.     Palpations: Abdomen is soft. There is no mass.     Tenderness: There is no abdominal tenderness.     Hernia: There is no hernia in the left inguinal area or right inguinal area.  Musculoskeletal:        General: Normal range of motion.     Cervical back: Normal range of motion and neck supple.     Right lower leg: No edema.     Left lower leg: No  edema.  Skin:    General: Skin is warm and dry.  Neurological:     General: No focal deficit present.     Mental Status: He is alert and oriented to person, place, and time. Mental status is at baseline.  Psychiatric:        Mood and Affect: Mood normal.        Behavior: Behavior normal.         Assessment & Plan:   1. Annual physical exam (Primary)  - Comprehensive metabolic panel with GFR  2. Screening for deficiency anemia  - CBC with Differential/Platelet  3. Screening for lipid disorders  - Lipid panel  4. Screening for endocrine/metabolic/immunity disorders  - TSH - VITAMIN D  25 Hydroxy (Vit-D Deficiency, Fractures)  5. Screening for prostate cancer  - PSA - POCT UA - Microscopic Only  6. Type 2 diabetes mellitus with other specified complication, without long-term current use of insulin (HCC)  - POCT glycosylated hemoglobin (Hb A1C) - Microalbumin / creatinine urine ratio - Hemoglobin A1c  7. Language barrier to communication    No follow-ups on file.   Tanda Raguel SQUIBB, MD

## 2023-07-12 ENCOUNTER — Telehealth: Payer: Self-pay

## 2023-07-12 LAB — COMPREHENSIVE METABOLIC PANEL WITH GFR
ALT: 17 IU/L (ref 0–44)
AST: 20 IU/L (ref 0–40)
Albumin: 4.7 g/dL (ref 3.8–4.9)
Alkaline Phosphatase: 71 IU/L (ref 44–121)
BUN/Creatinine Ratio: 15 (ref 10–24)
BUN: 18 mg/dL (ref 8–27)
Bilirubin Total: 0.6 mg/dL (ref 0.0–1.2)
CO2: 22 mmol/L (ref 20–29)
Calcium: 9.9 mg/dL (ref 8.6–10.2)
Chloride: 98 mmol/L (ref 96–106)
Creatinine, Ser: 1.23 mg/dL (ref 0.76–1.27)
Globulin, Total: 2.7 g/dL (ref 1.5–4.5)
Glucose: 120 mg/dL — ABNORMAL HIGH (ref 70–99)
Potassium: 4.7 mmol/L (ref 3.5–5.2)
Sodium: 138 mmol/L (ref 134–144)
Total Protein: 7.4 g/dL (ref 6.0–8.5)
eGFR: 67 mL/min/1.73 (ref >59)

## 2023-07-12 LAB — CBC WITH DIFFERENTIAL/PLATELET
Basophils Absolute: 0 x10E3/uL (ref 0.0–0.2)
Basos: 0 %
EOS (ABSOLUTE): 0.2 x10E3/uL (ref 0.0–0.4)
Eos: 2 %
Hematocrit: 47.9 % (ref 37.5–51.0)
Hemoglobin: 15.3 g/dL (ref 13.0–17.7)
Immature Grans (Abs): 0 x10E3/uL (ref 0.0–0.1)
Immature Granulocytes: 0 %
Lymphocytes Absolute: 2.1 x10E3/uL (ref 0.7–3.1)
Lymphs: 30 %
MCH: 29.2 pg (ref 26.6–33.0)
MCHC: 31.9 g/dL (ref 31.5–35.7)
MCV: 91 fL (ref 79–97)
Monocytes Absolute: 0.5 x10E3/uL (ref 0.1–0.9)
Monocytes: 6 %
Neutrophils Absolute: 4.3 x10E3/uL (ref 1.4–7.0)
Neutrophils: 62 %
Platelets: 290 x10E3/uL (ref 150–450)
RBC: 5.24 x10E6/uL (ref 4.14–5.80)
RDW: 11.8 % (ref 11.6–15.4)
WBC: 7 x10E3/uL (ref 3.4–10.8)

## 2023-07-12 LAB — MICROALBUMIN / CREATININE URINE RATIO
Creatinine, Urine: 105 mg/dL
Microalb/Creat Ratio: 7 mg/g{creat} (ref 0–29)
Microalbumin, Urine: 7.5 ug/mL

## 2023-07-12 LAB — LIPID PANEL
Chol/HDL Ratio: 4.1 ratio (ref 0.0–5.0)
Cholesterol, Total: 225 mg/dL — ABNORMAL HIGH (ref 100–199)
HDL: 55 mg/dL (ref >39)
LDL Chol Calc (NIH): 138 mg/dL — ABNORMAL HIGH (ref 0–99)
Triglycerides: 179 mg/dL — ABNORMAL HIGH (ref 0–149)
VLDL Cholesterol Cal: 32 mg/dL (ref 5–40)

## 2023-07-12 LAB — VITAMIN D 25 HYDROXY (VIT D DEFICIENCY, FRACTURES): Vit D, 25-Hydroxy: 23.7 ng/mL — ABNORMAL LOW (ref 30.0–100.0)

## 2023-07-12 LAB — TSH: TSH: 2.32 u[IU]/mL (ref 0.450–4.500)

## 2023-07-12 LAB — PSA: Prostate Specific Ag, Serum: 0.5 ng/mL (ref 0.0–4.0)

## 2023-07-12 LAB — HEMOGLOBIN A1C
Est. average glucose Bld gHb Est-mCnc: 151 mg/dL
Hgb A1c MFr Bld: 6.9 % — ABNORMAL HIGH (ref 4.8–5.6)

## 2023-07-12 NOTE — Telephone Encounter (Signed)
 Pt is wondering why he was prescribed Flomax  yesterday. I do not see any mention of Flomax  in the office note from yesterday. Please advise    Copied from CRM [id number]. Topic: Clinical - Medication Question >> Jul 12, 2023  3:35 PM Avram MATSU wrote: Reason for CRM: patient received test results yesterday and was prescribed tamsulosin  (FLOMAX ) 0.4 MG CAPS capsule [[id number]]  and was wondering what is this medication used for. [phone] (M)

## 2023-07-19 ENCOUNTER — Other Ambulatory Visit: Payer: Self-pay

## 2023-07-19 ENCOUNTER — Other Ambulatory Visit: Payer: Self-pay | Admitting: Family Medicine

## 2023-07-19 DIAGNOSIS — E119 Type 2 diabetes mellitus without complications: Secondary | ICD-10-CM

## 2023-07-19 MED ORDER — METFORMIN HCL 1000 MG PO TABS: 1000.0000 mg | ORAL_TABLET | Freq: Every day | ORAL | 0 refills | Status: AC

## 2023-07-19 MED ORDER — METFORMIN HCL 1000 MG PO TABS: 1000.0000 mg | ORAL_TABLET | Freq: Every day | ORAL | 0 refills | Status: DC

## 2023-07-19 NOTE — Telephone Encounter (Signed)
 Copied from CRM [id number]. Topic: Clinical - Medication Refill >> Jul 19, 2023  1:33 PM Tiffany S wrote: Medication: metFORMIN  (GLUCOPHAGE ) 1000 MG tablet [[id number]] ENDED  Has the patient contacted their pharmacy? Yes (Agent: If no, request that the patient contact the pharmacy for the refill. If patient does not wish to contact the pharmacy document the reason why and proceed with request.) (Agent: If yes, when and what did the pharmacy advise?)  This is the patient's preferred pharmacy:  [facility] [address] ([address] - [address] [place] ([place]) [place] [address] Phone: [phone] Fax: [phone]  Is this the correct pharmacy for this prescription? Yes If no, delete pharmacy and type the correct one.   Has the prescription been filled recently? Yes  Is the patient out of the medication? Yes  Has the patient been seen for an appointment in the last year OR does the patient have an upcoming appointment? Yes  Can we respond through MyChart? Yes  Agent: Please be advised that Rx refills may take up to 3 business days. We ask that you follow-up with your pharmacy.

## 2023-07-19 NOTE — Telephone Encounter (Unsigned)
 Copied from CRM [id number]. Topic: Clinical - Medication Refill >> Jul 19, 2023  1:33 PM Tiffany S wrote: Medication: metFORMIN  (GLUCOPHAGE ) 1000 MG tablet [[id number]] ENDED  Has the patient contacted their pharmacy? Yes (Agent: If no, request that the patient contact the pharmacy for the refill. If patient does not wish to contact the pharmacy document the reason why and proceed with request.) (Agent: If yes, when and what did the pharmacy advise?)  This is the patient's preferred pharmacy:  [facility] [address] ([address]), [place] - [address] [place] ([place]) [place] [address] Phone: [phone] Fax: [phone]  Is this the correct pharmacy for this prescription? Yes If no, delete pharmacy and type the correct one.   Has the prescription been filled recently? Yes  Is the patient out of the medication? Yes  Has the patient been seen for an appointment in the last year OR does the patient have an upcoming appointment? Yes  Can we respond through MyChart? Yes  Agent: Please be advised that Rx refills may take up to 3 business days. We ask that you follow-up with your pharmacy. >> Jul 19, 2023  1:39 PM Annabella S wrote: Patient requesting 90 day supply he is leaving the country

## 2023-07-19 NOTE — Telephone Encounter (Signed)
Refill for Metformin sent.

## 2023-07-19 NOTE — Addendum Note (Signed)
 Addended by: DELORES RUBY F on: 07/19/2023 03:50 PM   Modules accepted: Orders

## 2023-07-19 NOTE — Telephone Encounter (Signed)
 Copied from CRM [id number]. Topic: Clinical - Medication Refill >> Jul 19, 2023  1:33 PM Tiffany S wrote: Medication: metFORMIN  (GLUCOPHAGE ) 1000 MG tablet [[id number]] ENDED  Has the patient contacted their pharmacy? Yes (Agent: If no, request that the patient contact the pharmacy for the refill. If patient does not wish to contact the pharmacy document the reason why and proceed with request.) (Agent: If yes, when and what did the pharmacy advise?)  This is the patient's preferred pharmacy:  [facility] [address] ([address]), [place] - [address] [place] ([place] [address] Phone: [phone] Fax: [phone]  Is this the correct pharmacy for this prescription? Yes If no, delete pharmacy and type the correct one.   Has the prescription been filled recently? Yes  Is the patient out of the medication? Yes  Has the patient been seen for an appointment in the last year OR does the patient have an upcoming appointment? Yes  Can we respond through MyChart? Yes  Agent: Please be advised that Rx refills may take up to 3 business days. We ask that you follow-up with your pharmacy. >> Jul 19, 2023  1:39 PM Annabella S wrote: Patient requesting 90 day supply he is leaving the country

## 2023-08-09 ENCOUNTER — Ambulatory Visit: Payer: Self-pay

## 2023-08-09 ENCOUNTER — Ambulatory Visit: Payer: Self-pay | Admitting: Family Medicine

## 2023-08-09 MED ORDER — VITAMIN D (ERGOCALCIFEROL) 1.25 MG (50000 UNIT) PO CAPS: 50000.0000 [IU] | ORAL_CAPSULE | ORAL | 0 refills | Status: AC

## 2023-08-09 NOTE — Telephone Encounter (Signed)
 FYI Only or Action Required?: Action required by provider: lab or test result follow-up needed. Requesting lab results from 07/11/23, no results notes to provide.   Patient was last seen in primary care on 07/11/2023 by Tanda Bleacher, MD.  Called Nurse Triage reporting Lab results.  Symptoms began about a month ago.  Interventions attempted: Nothing.  Symptoms are: na.  Triage Disposition: Call PCP Within 24 Hours  Patient/caregiver understands and will follow disposition?: No, wishes to speak with PCP   Copied from CRM #[id number]. Topic: Clinical - Lab/Test Results >> Aug 09, 2023  2:38 PM Nathanel BROCKS wrote: Reason for CRM: pt is calling to get lab results, I have interpretur on the line. Reason for Disposition  Caller requesting lab results  (Exception: Routine or non-urgent lab result.)  Answer Assessment - Initial Assessment Questions 1. REASON FOR CALL or QUESTION: What is your reason for calling today? or How can I best     Lab results from 07/11/23 2. CALLER: Document the source of call. (e.g., laboratory staff, caregiver or patient).     Patient and interpreter  Additional notes: 1) Spanish interpreter on the call. PAS called [facility] for assistance but was deferred to nurse triage.  2) Reviewed lab results from 07/11/23, unfortunately no provider notes have been entered to relay to patient. Patient is inquiring why it is taking so long, he is concerned for his prostate. Requesting call back today or tomorrow morning to review lab results and recommendation from provider.  Protocols used: PCP Call - No Triage-A-AH

## 2024-07-10 ENCOUNTER — Encounter: Admitting: Family Medicine
